# Patient Record
Sex: Male | Born: 1975 | Race: White | Hispanic: No | Marital: Married | Smoking: Former smoker
Health system: Southern US, Community
[De-identification: ages and names within clinical notes are randomized; demographics above are authoritative.]

## PROBLEM LIST (undated history)

## (undated) DIAGNOSIS — G4730 Sleep apnea, unspecified: Secondary | ICD-10-CM

## (undated) DIAGNOSIS — E781 Pure hyperglyceridemia: Secondary | ICD-10-CM

## (undated) DIAGNOSIS — F32A Depression, unspecified: Secondary | ICD-10-CM

## (undated) DIAGNOSIS — F329 Major depressive disorder, single episode, unspecified: Secondary | ICD-10-CM

## (undated) DIAGNOSIS — Z9289 Personal history of other medical treatment: Secondary | ICD-10-CM

## (undated) HISTORY — PX: OTHER SURGICAL HISTORY: SHX169

## (undated) HISTORY — DX: Personal history of other medical treatment: Z92.89

## (undated) HISTORY — DX: Major depressive disorder, single episode, unspecified: F32.9

## (undated) HISTORY — DX: Pure hyperglyceridemia: E78.1

## (undated) HISTORY — DX: Depression, unspecified: F32.A

## (undated) HISTORY — DX: Sleep apnea, unspecified: G47.30

---

## 1998-08-08 ENCOUNTER — Encounter: Payer: Self-pay | Admitting: Emergency Medicine

## 1998-08-08 ENCOUNTER — Emergency Department (HOSPITAL_COMMUNITY): Admission: EM | Admit: 1998-08-08 | Discharge: 1998-08-08 | Payer: Self-pay | Admitting: Emergency Medicine

## 1998-08-11 ENCOUNTER — Emergency Department (HOSPITAL_COMMUNITY): Admission: EM | Admit: 1998-08-11 | Discharge: 1998-08-11 | Payer: Self-pay | Admitting: Emergency Medicine

## 2001-01-20 ENCOUNTER — Encounter: Payer: Self-pay | Admitting: Emergency Medicine

## 2001-01-20 ENCOUNTER — Emergency Department (HOSPITAL_COMMUNITY): Admission: EM | Admit: 2001-01-20 | Discharge: 2001-01-20 | Payer: Self-pay | Admitting: Emergency Medicine

## 2006-07-18 ENCOUNTER — Ambulatory Visit (HOSPITAL_COMMUNITY): Admission: RE | Admit: 2006-07-18 | Discharge: 2006-07-18 | Payer: Self-pay | Admitting: Gynecology

## 2010-11-14 LAB — HEMOGLOBINOPATHY EVALUATION
Hemoglobin Other: 0 (ref 0.0–0.0)
Hgb A2 Quant: 2 — ABNORMAL LOW
Hgb A: 98 % — ABNORMAL HIGH
Hgb F Quant: 0 (ref 0.0–2.0)
Hgb S Quant: 0 % (ref 0.0–0.0)

## 2010-11-14 LAB — DIFFERENTIAL
Basophils Absolute: 0
Basophils Relative: 1
Eosinophils Absolute: 0.1
Eosinophils Relative: 1
Lymphocytes Relative: 48 — ABNORMAL HIGH
Lymphs Abs: 3.1
Monocytes Absolute: 0.5
Monocytes Relative: 7
Neutro Abs: 2.8
Neutrophils Relative %: 43

## 2010-11-14 LAB — CBC
HCT: 45.1
Hemoglobin: 15.2
MCHC: 33.7
MCV: 89.6
Platelets: 228
RBC: 5.03
RDW: 13
WBC: 6.5

## 2013-12-30 ENCOUNTER — Ambulatory Visit (INDEPENDENT_AMBULATORY_CARE_PROVIDER_SITE_OTHER): Payer: BC Managed Care – PPO | Admitting: Family Medicine

## 2013-12-30 ENCOUNTER — Ambulatory Visit (INDEPENDENT_AMBULATORY_CARE_PROVIDER_SITE_OTHER): Payer: BC Managed Care – PPO

## 2013-12-30 VITALS — BP 132/84 | HR 98 | Temp 98.3°F | Resp 16 | Ht 72.0 in | Wt 230.2 lb

## 2013-12-30 DIAGNOSIS — R05 Cough: Secondary | ICD-10-CM

## 2013-12-30 DIAGNOSIS — R059 Cough, unspecified: Secondary | ICD-10-CM

## 2013-12-30 DIAGNOSIS — J209 Acute bronchitis, unspecified: Secondary | ICD-10-CM

## 2013-12-30 LAB — POCT CBC
Granulocyte percent: 65.1 %G (ref 37–80)
HCT, POC: 45.5 % (ref 43.5–53.7)
Hemoglobin: 14.9 g/dL (ref 14.1–18.1)
LYMPH, POC: 1.8 (ref 0.6–3.4)
MCH: 30.7 pg (ref 27–31.2)
MCHC: 32.8 g/dL (ref 31.8–35.4)
MCV: 93.5 fL (ref 80–97)
MID (CBC): 0.5 (ref 0–0.9)
MPV: 7.5 fL (ref 0–99.8)
PLATELET COUNT, POC: 196 10*3/uL (ref 142–424)
POC Granulocyte: 4.3 (ref 2–6.9)
POC LYMPH PERCENT: 27.5 %L (ref 10–50)
POC MID %: 7.4 % (ref 0–12)
RBC: 4.86 M/uL (ref 4.69–6.13)
RDW, POC: 14.1 %
WBC: 6.6 10*3/uL (ref 4.6–10.2)

## 2013-12-30 MED ORDER — BENZONATATE 100 MG PO CAPS: 100.0000 mg | ORAL_CAPSULE | Freq: Three times a day (TID) | ORAL | Status: DC | PRN

## 2013-12-30 MED ORDER — PREDNISONE 20 MG PO TABS: ORAL_TABLET | ORAL | Status: DC

## 2013-12-30 MED ORDER — AZITHROMYCIN 250 MG PO TABS: ORAL_TABLET | ORAL | Status: DC

## 2013-12-30 NOTE — Patient Instructions (Signed)
Drink plenty of fluids and get enough rest  Take the Tessalon (benzonatate) pills one or 2 pills 3 times daily as needed for cough.  Take the prednisone 3 pills daily simultaneously after breakfast for 3 days  Take the azithromycin (antibiotic) to initially today, then 1 daily for 4 days  Continue to stay away from the cigarettes  If the cough continues to persist please return

## 2013-12-30 NOTE — Progress Notes (Signed)
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 He has been sick over the past month with a cough and some wheezing. He is felt like the probably pass on back, so he tried to titrate it through. He quit smoking about 3 weeks ago. Has not been running any fever. His wife had a similar problem, and it up with pneumonia, and is doing better. However her coughing does wake him up some. He seems to sleep okay most of the time. He does wear CPAP. He has not been documented as being febrile. He feels some rattling in the right lower lung anteriorly at times.  Objective: His TMs are normal. Throat clear. Neck supple without nodes. Chest is clear with exception of the right lower lobe anteriorly which at a scattered soft wheezes. Heart regular without murmurs.  Assessment: Cough Right lower lobe congestion and wheezing Recently quit cigarette smoking  Plan: Chest x-ray and CBC  Results for orders placed or performed in visit on 12/30/13  POCT CBC  Result Value Ref Range   WBC 6.6 4.6 - 10.2 K/uL   Lymph, poc 1.8 0.6 - 3.4   POC LYMPH PERCENT 27.5 10 - 50 %L   MID (cbc) 0.5 0 - 0.9   POC MID % 7.4 0 - 12 %M   POC Granulocyte 4.3 2 - 6.9   Granulocyte percent 65.1 37 - 80 %G   RBC 4.86 4.69 - 6.13 M/uL   Hemoglobin 14.9 14.1 - 18.1 g/dL   HCT, POC 16.145.5 09.643.5 - 53.7 %   MCV 93.5 80 - 97 fL   MCH, POC 30.7 27 - 31.2 pg   MCHC 32.8 31.8 - 35.4 g/dL   RDW, POC 04.514.1 %   Platelet Count, POC 196 142 - 424 K/uL   MPV 7.5 0 - 99.8 fL   [facility] reading (PRIMARY) by  Dr. Alwyn RenHopper Normal chest x-ray  Assessment: Bronchitis, etiology undetermined  Plan: Azithromycin which would cover also for atypicals Benzonatate 3 days of prednisone  Return if worse or not improving

## 2014-02-06 ENCOUNTER — Ambulatory Visit (INDEPENDENT_AMBULATORY_CARE_PROVIDER_SITE_OTHER): Payer: BC Managed Care – PPO | Admitting: Emergency Medicine

## 2014-02-06 VITALS — BP 130/84 | HR 83 | Temp 99.0°F | Resp 18 | Ht 72.0 in | Wt 235.8 lb

## 2014-02-06 DIAGNOSIS — Z23 Encounter for immunization: Secondary | ICD-10-CM

## 2014-02-06 DIAGNOSIS — S6991XA Unspecified injury of right wrist, hand and finger(s), initial encounter: Secondary | ICD-10-CM

## 2014-02-06 DIAGNOSIS — M6588 Other synovitis and tenosynovitis, other site: Secondary | ICD-10-CM

## 2014-02-06 DIAGNOSIS — W5501XA Bitten by cat, initial encounter: Secondary | ICD-10-CM

## 2014-02-06 DIAGNOSIS — M659 Synovitis and tenosynovitis, unspecified: Secondary | ICD-10-CM

## 2014-02-06 MED ORDER — MOXIFLOXACIN HCL 400 MG PO TABS: 400.0000 mg | ORAL_TABLET | Freq: Every day | ORAL | Status: DC

## 2014-02-06 MED ORDER — HYDROCODONE-ACETAMINOPHEN 5-325 MG PO TABS: 1.0000 | ORAL_TABLET | ORAL | Status: DC | PRN

## 2014-02-06 MED ORDER — CLINDAMYCIN HCL 300 MG PO CAPS: 300.0000 mg | ORAL_CAPSULE | Freq: Three times a day (TID) | ORAL | Status: DC

## 2014-02-06 MED ORDER — CEFTRIAXONE SODIUM 1 G IJ SOLR
1.0000 g | Freq: Once | INTRAMUSCULAR | Status: AC
  Administered 2014-02-06: 1 g via INTRAMUSCULAR

## 2014-02-06 NOTE — Patient Instructions (Signed)
Infectious Finger Tenosynovitis °Infection of the finger tendon sheath generally occurs because of a puncture wound which introduces bacteria into the tendon sheath. The tendons bend the fingers. The tendons are housed in tunnels called sheaths. These sheaths keep the tendons aligned with the finger joints.  °CAUSES  °When a puncture wound introduces bacteria, the closed space around the tendons can become the site of infection. Wounds are often the result of: °· Sharp objects such as a nail. °· An animal bite. °· A human bite. °SYMPTOMS  °The body sends white blood cells into the area to kill the bacteria. This process will produce inflammatory chemicals which cause: °· Swelling. °· Decreased motion. °· Severe pain with any motion of the finger. °Following the puncture wound the finger becomes increasingly swollen and warm. Pain with any motion soon follows. Red streaks may form on the skin which can move up into the forearm. Fever and chills usually result. This is an emergency and medical attention should be sought urgently. °DIAGNOSIS  °Your caregiver will easily make this diagnosis on examination. °TREATMENT  °· A splint may be used on a short term basis. Follow your caregiver's instructions on when and where to remove the splint for exercising the finger. °· The finger is usually splinted for a brief period of a few days while antibiotic therapy is initiated. Once the swelling, redness and warmth is improved, motion of the finger is begun. °· If the diagnosis is not clear your caregiver may prescribe antibiotics and ask you to follow-up within 24 to 48 hours for a repeat examination. °· Your caregiver may insert a needle in the tendon sheath (aspirate) to get a culture for the purpose of directing antibiotic therapy. °· Only take over-the-counter or prescription medicines for pain, discomfort, or fever as directed by your caregiver. °· Surgery is another treatment that is frequently used if the diagnosis is  clear. Small incisions are made along the palm and in the finger. A catheter is inserted to irrigate the tendon sheath after a culture is obtained. °· Occupational or hand therapy is often required to eliminate stiffness remaining in the finger. °COMPLICATIONS °Complications may occur. They include: °· Recurrence of the infection. This does not mean that the surgery was not well done. It means that the bacteria were not completely eliminated by the surgery and antibiotics. Repeat irrigation in the operating room may be required. °· Permanent stiffness of the finger can result if you do not follow through with instructions on exercises or fail to report for your occupational or hand therapy appointments. °SEEK IMMEDIATE MEDICAL CARE IF:  °If the swelling, redness, warmth and pain with motion recur, especially if accompanied by a fever of greater than 101° F (38.3° C). °Document Released: 04/12/2008 Document Revised: 01/19/2013 Document Reviewed: 04/12/2008 °ExitCare® Patient Information ©2015 [employer]. This information is not intended to replace advice given to you by your health care provider. Make sure you discuss any questions you have with your health care provider. ° °

## 2014-02-06 NOTE — Progress Notes (Signed)
[facility] [address], [place] [id number] [phone]  Date:  02/06/2014   Name:  Trevor SpragueDavid E Moquin   DOB:  [birth year]   MRN:  [id number]  PCP:  No PCP Per Patient    Chief Complaint: animal bite   History of Present Illness:  Trevor SpragueDavid E Trevor Leblanc is a 39 y.o. very pleasant male patient who presents with the following:  Bitten by housebound cat Friday in the left hand  Subsequently punched a wall and went for a walk Now has swollen erythematous painful left hand No fever or chills He is not current on tetanus Cat is current on rabies No improvement with over the counter medications or other home remedies.  Denies other complaint or health concern today.   There are no active problems to display for this patient.   Past Medical History  Diagnosis Date  . Depression   . Sleep apnea     History reviewed. No pertinent past surgical history.  History  Substance Use Topics  . Smoking status: Former Smoker    Quit date: 11/30/2013  . Smokeless tobacco: Not on file  . Alcohol Use: 6.0 oz/week    10 Not specified per week    Family History  Problem Relation Age of Onset  . Hypertension Mother   . Stroke Mother   . Hyperlipidemia Father   . Diabetes Maternal Grandmother   . Diabetes Paternal Grandmother   . Cancer Paternal Grandfather     Allergies  Allergen Reactions  . Penicillins Anaphylaxis    Medication list has been reviewed and updated.  Current Outpatient Prescriptions on File Prior to Visit  Medication Sig Dispense Refill  . azithromycin (ZITHROMAX) 250 MG tablet Take 2 tabs PO x 1 dose, then 1 tab PO QD x 4 days (Patient not taking: Reported on 02/06/2014) 6 tablet 0  . benzonatate (TESSALON) 100 MG capsule Take 1-2 capsules (100-200 mg total) by mouth 3 (three) times daily as needed. (Patient not taking: Reported on 02/06/2014) 30 capsule 0  . nicotine (NICODERM CQ - DOSED IN MG/24 HR) 7 mg/24hr patch Place 7 mg onto the skin daily.     . predniSONE (DELTASONE) 20 MG tablet Take 3 pills daily for 3 days for inflammation and lungs. Best taken after breakfast (Patient not taking: Reported on 02/06/2014) 9 tablet 0   No current facility-administered medications on file prior to visit.    Review of Systems:  As per HPI, otherwise negative.    Physical Examination: Filed Vitals:   02/06/14 1024  BP: 130/84  Pulse: 83  Temp: 99 F (37.2 C)  Resp: 18   Filed Vitals:   02/06/14 1024  Height: 6' (1.829 m)  Weight: 235 lb 12.8 oz (106.958 kg)   Body mass index is 31.97 kg/(m^2). Ideal Body Weight: Weight in (lb) to have BMI = 25: 183.9   GEN: WDWN, NAD, Non-toxic, Alert & Oriented x 3 HEENT: Atraumatic, Normocephalic.  Ears and Nose: No external deformity. EXTR: No clubbing/cyanosis/edema NEURO: Normal gait.  PSYCH: Normally interactive. Conversant. Not depressed or anxious appearing.  Calm demeanor.  Left hand:  Numerous scratches on forearm dorsally.  Bites at 3rd MCP joint. Marked erythema and swelling with guarding of 3rd finger passive extension   Assessment and Plan: Tenosynovitis vs joint infection  Cat bite Rocephin 1.5 gm moxifloxacin clinda (penicillin allergy_can take amox) Splint Hand Monday  Signed,  Phillips OdorJeffery Doria Fern, MD

## 2017-06-12 ENCOUNTER — Other Ambulatory Visit: Payer: Self-pay

## 2017-06-12 ENCOUNTER — Emergency Department (HOSPITAL_COMMUNITY)
Admission: EM | Admit: 2017-06-12 | Discharge: 2017-06-12 | Disposition: A | Discharge status: Home / Self Care | Payer: BC Managed Care – PPO | Attending: Emergency Medicine | Admitting: Emergency Medicine

## 2017-06-12 ENCOUNTER — Encounter (HOSPITAL_COMMUNITY): Payer: Self-pay | Admitting: Emergency Medicine

## 2017-06-12 ENCOUNTER — Emergency Department (HOSPITAL_COMMUNITY): Payer: BC Managed Care – PPO

## 2017-06-12 DIAGNOSIS — R079 Chest pain, unspecified: Secondary | ICD-10-CM | POA: Diagnosis present

## 2017-06-12 DIAGNOSIS — R072 Precordial pain: Secondary | ICD-10-CM | POA: Insufficient documentation

## 2017-06-12 DIAGNOSIS — Z79899 Other long term (current) drug therapy: Secondary | ICD-10-CM | POA: Insufficient documentation

## 2017-06-12 LAB — CBC
HEMATOCRIT: 44.9 % (ref 39.0–52.0)
HEMOGLOBIN: 14.9 g/dL (ref 13.0–17.0)
MCH: 30.5 pg (ref 26.0–34.0)
MCHC: 33.2 g/dL (ref 30.0–36.0)
MCV: 92 fL (ref 78.0–100.0)
Platelets: 234 10*3/uL (ref 150–400)
RBC: 4.88 MIL/uL (ref 4.22–5.81)
RDW: 12.4 % (ref 11.5–15.5)
WBC: 6.3 10*3/uL (ref 4.0–10.5)

## 2017-06-12 LAB — BASIC METABOLIC PANEL
ANION GAP: 11 (ref 5–15)
BUN: 14 mg/dL (ref 6–20)
CHLORIDE: 106 mmol/L (ref 101–111)
CO2: 21 mmol/L — AB (ref 22–32)
Calcium: 9.1 mg/dL (ref 8.9–10.3)
Creatinine, Ser: 0.85 mg/dL (ref 0.61–1.24)
GFR calc Af Amer: >60 mL/min (ref >60)
GFR calc non Af Amer: >60 mL/min (ref >60)
GLUCOSE: 110 mg/dL — AB (ref 65–99)
POTASSIUM: 4.5 mmol/L (ref 3.5–5.1)
Sodium: 138 mmol/L (ref 135–145)

## 2017-06-12 LAB — I-STAT TROPONIN, ED
Troponin i, poc: 0 ng/mL (ref 0.00–0.08)
Troponin i, poc: 0.01 ng/mL (ref 0.00–0.08)

## 2017-06-12 NOTE — ED Notes (Signed)
Pt verbalized understanding discharge instructions and denies any further needs or questions at this time. VS stable, ambulatory and steady gait.   

## 2017-06-12 NOTE — ED Provider Notes (Signed)
[facility] EMERGENCY DEPARTMENT Provider Note   CSN: [id number] Arrival date & time: 06/12/17  1033     History   Chief Complaint Chief Complaint  Patient presents with  . Chest Pain    HPI Trevor Leblanc is a 42 y.o. male.  Patient with possible history of hypertension, prediabetes, has not seen a doctor in about 5 years and has not been treated for these other than by diet, remote smoker --presents the emergency department today with complaint of chest pain described as a tightness.  Patient developed symptoms about 2 PM yesterday.  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  Symptoms did not really go away and he had difficulty sleeping last night.  He went to an urgent care today due to persistent symptoms and elevated BPs at home and was referred to the emergency department.  Patient denies any associated diaphoresis.  Patient does some light exercise (fishing mostly) and has not experienced any symptoms with exertion.  Tightness does not radiate.  While at the urgent care patient received aspirin and nitroglycerin which was relieved his symptoms.  No associated fever or cough.  No nausea, vomiting, or diarrhea. Family history of heart disease on fathers side but not at young ages. Patient denies risk factors for pulmonary embolism including: unilateral leg swelling, history of DVT/PE/other blood clots, use of exogenous hormones, recent immobilizations, recent surgery, recent travel (>4hr segment), malignancy, hemoptysis.       Past Medical History:  Diagnosis Date  . Depression   . Sleep apnea     There are no active problems to display for this patient.   History reviewed. No pertinent surgical history.      Home Medications    Prior to Admission medications   Medication Sig Start Date End Date Taking? Authorizing Provider  Multiple Vitamins-Minerals (MULTIVITAMIN WITH MINERALS) tablet Take 1 tablet by mouth daily.   Yes  [provider]  Omega-3 Fatty Acids (FISH OIL PO) Take 1 capsule by mouth daily.   Yes [provider]  benzonatate (TESSALON) 100 MG capsule Take 1-2 capsules (100-200 mg total) by mouth 3 (three) times daily as needed. Patient not taking: Reported on 02/06/2014 12/30/13   Peyton Najjar, MD  HYDROcodone-acetaminophen [facility]) 5-325 MG per tablet Take 1-2 tablets by mouth every 4 (four) hours as needed. Patient not taking: Reported on 06/12/2017 02/06/14   Carmelina Dane, MD    Family History Family History  Problem Relation Age of Onset  . Hypertension Mother   . Stroke Mother   . Hyperlipidemia Father   . Diabetes Maternal Grandmother   . Diabetes Paternal Grandmother   . Cancer Paternal Grandfather     Social History Social History   Tobacco Use  . Smoking status: Former Smoker    Last attempt to quit: 11/30/2013    Years since quitting: 3.5  . Smokeless tobacco: Never Used  Substance Use Topics  . Alcohol use: Yes    Alcohol/week: 6.0 oz    Types: 10 Standard drinks or equivalent per week  . Drug use: No     Allergies   Penicillins   Review of Systems Review of Systems  Constitutional: Negative for diaphoresis and fever.  Eyes: Negative for redness.  Respiratory: Negative for cough and shortness of breath.   Cardiovascular: Positive for chest pain. Negative for palpitations and leg swelling.  Gastrointestinal: Negative for abdominal pain, nausea and vomiting.  Genitourinary: Negative  for dysuria.  Musculoskeletal: Negative for back pain and neck pain.  Skin: Negative for rash.  Neurological: Negative for syncope and light-headedness.  Psychiatric/Behavioral: The patient is not nervous/anxious.      Physical Exam Updated Vital Signs BP (!) 161/113 (BP Location: Right Arm)   Pulse 84   Temp 98.2 F (36.8 C) (Oral)   Resp 18   SpO2 100%   Physical Exam  Constitutional: He appears well-developed and well-nourished.  HENT:  Head:  Normocephalic and atraumatic.  Mouth/Throat: Mucous membranes are normal. Mucous membranes are not dry.  Eyes: Conjunctivae are normal.  Neck: Trachea normal and normal range of motion. Neck supple. Normal carotid pulses and no JVD present. No muscular tenderness present. Carotid bruit is not present. No tracheal deviation present.  Cardiovascular: Normal rate, regular rhythm, S1 normal, S2 normal, normal heart sounds and intact distal pulses. Exam reveals no distant heart sounds and no decreased pulses.  No murmur heard. Pulmonary/Chest: Effort normal and breath sounds normal. No respiratory distress. He has no wheezes. He exhibits no tenderness.  Abdominal: Soft. Normal aorta and bowel sounds are normal. There is no tenderness. There is no rebound and no guarding.  Musculoskeletal: He exhibits no edema.  Neurological: He is alert.  Skin: Skin is warm and dry. He is not diaphoretic. No cyanosis. No pallor.  Psychiatric: He has a normal mood and affect.  Nursing note and vitals reviewed.    ED Treatments / Results  Labs (all labs ordered are listed, but only abnormal results are displayed) Labs Reviewed  BASIC METABOLIC PANEL - Abnormal; Notable for the following components:      Result Value   CO2 21 (*)    Glucose, Bld 110 (*)    All other components within normal limits  CBC  I-STAT TROPONIN, ED  I-STAT TROPONIN, ED    EKG None   ED ECG REPORT   Date: 06/12/2017  Rate: 81  Rhythm: normal sinus rhythm  QRS Axis: normal  Intervals: normal  ST/T Wave abnormalities: normal  Conduction Disutrbances:none  Narrative Interpretation:   Old EKG Reviewed: none available  I have personally reviewed the EKG tracing and agree with the computerized printout as noted.   Radiology Dg Chest 2 View  Result Date: 06/12/2017 CLINICAL DATA:  Chest pain, hypertension EXAM: CHEST - 2 VIEW COMPARISON:  Chest x-ray of 12/30/2013 FINDINGS: No active infiltrate or effusion is seen.  Mediastinal and hilar contours are unremarkable. The heart is borderline enlarged. No bony abnormality is seen. IMPRESSION: Borderline cardiomegaly.  No active lung disease. Electronically Signed   By: Dwyane Dee M.D.   On: 06/12/2017 11:28    Procedures Procedures (including critical care time)  Medications Ordered in ED Medications - No data to display   Initial Impression / Assessment and Plan / ED Course  I have reviewed the triage vital signs and the nursing notes.  Pertinent labs & imaging results that were available during my care of the patient were reviewed by me and considered in my medical decision making (see chart for details).     Patient seen and examined. EKG reviewed. Work-up initiated.  Pending second troponin.  If negative, anticipate discharged home with cardiology/PCP follow-up.  Vital signs reviewed and are as follows: BP (!) 161/113 (BP Location: Right Arm)   Pulse 84   Temp 98.2 F (36.8 C) (Oral)   Resp 18   Ht  (1.778 m)   Wt 106.6 kg (235 lb)   SpO2 100%  BMI 33.72 kg/m   3:11 PM second troponin is negative.  Patient's pain remains controlled.  We will discharge to home.  Patient was counseled to return with severe chest pain, especially if the pain is crushing or pressure-like and spreads to the arms, back, neck, or jaw, or if they have sweating, nausea, or shortness of breath with the pain. They were encouraged to call 911 with these symptoms.   They were also told to return if their chest pain gets worse and does not go away with rest, they have an attack of chest pain lasting longer than usual despite rest and treatment with the medications their caregiver has prescribed, if they wake from sleep with chest pain or shortness of breath, if they feel dizzy or faint, if they have chest pain not typical of their usual pain, or if they have any other emergent concerns regarding their health.  The patient verbalized understanding and agreed.     Final Clinical Impressions(s) / ED Diagnoses   Final diagnoses:  Precordial pain   Patient with CP since yesterday. EKG without acute ischemia. Trop neg x 2.  HEART = 1-2 based on questionable number of risk factors. ? Cardiomegaly based on EKG and CXR.  No significant risk factors for PE and symptoms are not suggestive of such.  No tachycardia or hypoxia.  No clinical signs of DVT.  Doubt dissection given presentation. Will have patient follow-up with PCP and cardiology for further evaluation.   No dangerous or life-threatening conditions suspected or identified by history, physical exam, and by work-up. No indications for hospitalization identified.    ED Discharge Orders    None       Renne Crigler, Cordelia Poche 06/12/17 1529    Margarita Grizzle, MD 06/12/17 2231

## 2017-06-12 NOTE — ED Triage Notes (Signed)
Pt to ER for evaluation of 2 days non-radiating CP. Was sent from [facility] for further evaluation. Was given 324 mg aspirin and 0.4 nitro with complete relief. 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 Hx of HTN, high cholesterol, and has not seen a PCP in 5+ years. 18 g LH.

## 2017-06-12 NOTE — Discharge Instructions (Signed)
Please read and follow all provided instructions.  Your diagnoses today include:  1. Precordial pain     Tests performed today include:  An EKG of your heart  A chest x-ray - shows borderline large heart size  Cardiac enzymes - a blood test for heart muscle damage  Blood counts and electrolytes  Vital signs. See below for your results today.   Medications prescribed:   None  Take any prescribed medications only as directed.  Follow-up instructions: Please follow-up with your primary care provider and the cardiologist as soon as you can for further evaluation of your symptoms.   Return instructions:  SEEK IMMEDIATE MEDICAL ATTENTION IF:  You have severe chest pain, especially if the pain is crushing or pressure-like and spreads to the arms, back, neck, or jaw, or if you have sweating, nausea (feeling sick to your stomach), or shortness of breath. THIS IS AN EMERGENCY. Don't wait to see if the pain will go away. Get medical help at once. Call 911 or 0 (operator). DO NOT drive yourself to the hospital.   Your chest pain gets worse and does not go away with rest.   You have an attack of chest pain lasting longer than usual, despite rest and treatment with the medications your caregiver has prescribed.   You wake from sleep with chest pain or shortness of breath.  You feel dizzy or faint.  You have chest pain not typical of your usual pain for which you originally saw your caregiver.   You have any other emergent concerns regarding your health.  Additional Information: Chest pain comes from many different causes. Your caregiver has diagnosed you as having chest pain that is not specific for one problem, but does not require admission.  You are at low risk for an acute heart condition or other serious illness.   Your vital signs today were: BP (!) 161/113 (BP Location: Right Arm)    Pulse 84    Temp 98.2 F (36.8 C) (Oral)    Resp 18    Ht  (1.778 m)    Wt 106.6 kg  (235 lb)    SpO2 100%    BMI 33.72 kg/m  If your blood pressure (BP) was elevated above 135/85 this visit, please have this repeated by your doctor within one month. --------------

## 2017-07-23 ENCOUNTER — Encounter: Payer: Self-pay | Admitting: Physician Assistant

## 2017-07-23 ENCOUNTER — Ambulatory Visit: Payer: BC Managed Care – PPO | Admitting: Physician Assistant

## 2017-07-23 VITALS — BP 130/82 | HR 73 | Ht 70.0 in | Wt 240.0 lb

## 2017-07-23 DIAGNOSIS — I517 Cardiomegaly: Secondary | ICD-10-CM | POA: Diagnosis not present

## 2017-07-23 DIAGNOSIS — G4733 Obstructive sleep apnea (adult) (pediatric): Secondary | ICD-10-CM | POA: Diagnosis not present

## 2017-07-23 DIAGNOSIS — R0789 Other chest pain: Secondary | ICD-10-CM | POA: Diagnosis not present

## 2017-07-23 NOTE — Progress Notes (Signed)
Cardiology Office Note:    Date:  07/23/2017   ID:  Trevor Leblanc, DOB 04/11/1975, MRN [id number]  PCP:  Medicine, [facility]  Cardiologist:  New   Referring MD: Medicine, Trevor Leblanc Niece, [place])  Chief Complaint  Patient presents with  . Chest Pain    History of Present Illness:    Trevor Leblanc is a 42 y.o. male with depression who is being seen today for the evaluation of chest pain at the request of Medicine, [facility] ([address] [place], [place]).    Trevor Leblanc has no known history of coronary artery disease.  He was seen in the ED last month with chest pain and elevated blood pressure.  Troponin levels were negative. He had a chest X-ray with borderline cardiomegaly.  He notes that his pain had been going on for 2 days when he went to urgent care.  His BP was 180/100 and he was sent to the ED.  His pain was a left sided and substernal tightness.  He did have some associated shortness of breath, nausea.  There was no radiation.  It started during a time of emotional stress. He denies exertional chest pain or dyspnea on exertion.  Of note, his pain improved with NTG in the ED.  He has followed up with primary care since.  He was drinking several alcoholic drinks a day.  He reduced this and made some lifestyle changes.  He was also placed on Prozac for depression.  His blood pressure has been good since.  He has not had any further chest pain.    PAD Screen 07/23/2017  Previous PAD dx? No  Previous surgical procedure? No  Pain with walking? No  Feet/toe relief with dangling? No  Painful, non-healing ulcers? No  Extremities discolored? No    Prior CV studies:   The following studies were reviewed today:  None   Past Medical History:  Diagnosis Date  . Depression   . High triglycerides   . Sleep apnea    on CPAP (previously saw Dr. Brett Fairy)    Past Surgical History:  Procedure Laterality Date  . lesion excised from finger      Current  Medications: Current Meds  Medication Sig  . FLUoxetine (PROZAC) 10 MG capsule Take 1 capsule by mouth daily.  . Multiple Vitamins-Minerals (MULTIVITAMIN WITH MINERALS) tablet Take 1 tablet by mouth daily.  . Omega-3 Fatty Acids (FISH OIL PO) Take 1 capsule by mouth daily.     Allergies:   Penicillins   Social History   Socioeconomic History  . Marital status: Married    Spouse name: Not on file  . Number of children: 1  . Years of education: Not on file  . Highest education level: Not on file  Occupational History  . Occupation: Consulting civil engineer: [employer]    Comment: [employer]  Social Needs  . Financial resource strain: Not on file  . Food insecurity:    Worry: Not on file    Inability: Not on file  . Transportation needs:    Medical: Not on file    Non-medical: Not on file  Tobacco Use  . Smoking status: Former Smoker    Last attempt to quit: 11/30/2013    Years since quitting: 3.6  . Smokeless tobacco: Never Used  Substance and Sexual Activity  . Alcohol use: Yes    Alcohol/week: 6.0 oz    Types: 10 Standard drinks  or equivalent per week  . Drug use: No  . Sexual activity: Not on file  Lifestyle  . Physical activity:    Days per week: Not on file    Minutes per session: Not on file  . Stress: Not on file  Relationships  . Social connections:    Talks on phone: Not on file    Gets together: Not on file    Attends religious service: Not on file    Active member of club or organization: Not on file    Attends meetings of clubs or organizations: Not on file    Relationship status: Not on file  Other Topics Concern  . Not on file  Social History Narrative   Living in [place] area since late 1990s     Family Hx: The patient's family history includes CAD in his other; Cancer in his paternal grandfather; Depression in his mother; Diabetes in his maternal grandmother and paternal grandmother; Hyperlipidemia in his father;  Hypertension in his mother.  ROS:   Please see the history of present illness.    Review of Systems  Cardiovascular: Positive for chest pain.  Respiratory: Positive for snoring.   Psychiatric/Behavioral: Positive for depression. The patient is nervous/anxious.    All other systems reviewed and are negative.   EKGs/Labs/Other Test Reviewed:    EKG:  EKG is ordered today.  The ekg ordered today demonstrates normal sinus rhythm, HR 73, normal axis, QTc 423 ms  Recent Labs: 06/12/2017: BUN 14; Creatinine, Ser 0.85; Hemoglobin 14.9; Platelets 234; Potassium 4.5; Sodium 138   Labs from 06/17/17: Component Name Value Ref Range  WBC 5.5 3.4 - 10.8 x10E3/uL  RBC 4.69 4.14 - 5.8 x10E6/uL  Hemoglobin 14.8 13 - 17.7 g/dL  Hematocrit 42.1 37.5 - 51 %  MCV 90 79 - 97 fL  MCH 31.6 26.6 - 33 pg  MCHC 35.2 31.5 - 35.7 g/dL  RDW 14.2 12.3 - 15.4 %  Platelet Count 196  Comment:  **Please note reference interval change** 150 - 450 x10E3/uL   Component Name Value Ref Range  Glucose 101 (H) 65 - 99 mg/dL  BUN 15 6 - 24 mg/dL  Creatinine, Serum 0.88 0.76 - 1.27 mg/dL  eGFR If NonAfrican American 106 >59 mL/min/1.73  eGFR If African American 122 >59 mL/min/1.73  BUN/Creatinine Ratio 17 9 - 20   Sodium 139 134 - 144 mmol/L  Potassium 4.3 3.5 - 5.2 mmol/L  Chloride 103 96 - 106 mmol/L  CO2 21 20 - 29 mmol/L  CALCIUM 9.6 8.7 - 10.2 mg/dL  Total Protein 7.0 6 - 8.5 g/dL  Albumin, Serum 4.9 3.5 - 5.5 g/dL  Globulin, Total 2.1 1.5 - 4.5 g/dL  Albumin/Globulin Ratio 2.3 (H) 1.2 - 2.2   Total Bilirubin 0.4 0 - 1.2 mg/dL  Alkaline Phosphatase 39 39 - 117 IU/L  AST 26 0 - 40 IU/L  ALT (SGPT) 39 0 - 44 IU/L   Component Name Value Ref Range  Cholesterol, Total 212 (H) 100 - 199 mg/dL  Triglycerides 242 (H) 0 - 149 mg/dL  HDL 38 (L) >39 mg/dL  VLDL Cholesterol Cal 48 (H) 5 - 40 mg/dL  LDL 126 (H) 0 - 99 mg/dL     Recent Lipid Panel No results found for: CHOL, TRIG, HDL, CHOLHDL, LDLCALC,  LDLDIRECT  Physical Exam:    VS:  BP 130/82   Pulse 73   Ht '5\' 10"'  (1.778 m)   Wt 240 lb (108.9 kg)   SpO2  98%   BMI 34.44 kg/m     Wt Readings from Last 3 Encounters:  07/23/17 240 lb (108.9 kg)  06/12/17 235 lb (106.6 kg)  02/06/14 235 lb 12.8 oz (107 kg)     Physical Exam  Constitutional: He is oriented to person, place, and time. He appears well-developed and well-nourished. No distress.  HENT:  Head: Normocephalic and atraumatic.  Neck: No JVD present. Carotid bruit is not present.  Cardiovascular: Normal rate, regular rhythm and normal heart sounds.  No murmur heard. Pulmonary/Chest: Effort normal and breath sounds normal. He has no rales.  Abdominal: Soft. There is no hepatomegaly.  Musculoskeletal: He exhibits no edema.  Neurological: He is alert and oriented to person, place, and time.  Skin: Skin is warm and dry.    ASSESSMENT & PLAN:    Other chest pain His chest pain is somewhat atypical for ischemia.  But, he did have relief with NTG.  His symptoms may have been related to a GI etiology or esophageal spasm.  He has not had any further symptoms.  His BP is better on antidepressant medication.  He has very few risk factors for CAD (very remote FHx, remote hx of smoking).  His ECG is without ischemic changes.  I have recommend proceeding with an exercise tolerance test to screen for ischemic heart disease.  I discussed this with Dr. Rayann Heman (attending MD) who agreed.  -Schedule GXT  Cardiomegaly  Borderline cardiomegaly noted on recent CXR.  This may be related to his hx of obstructive sleep apnea.  Will obtain an echocardiogram to assess LV and RV function and pulmonary pressures.   OSA (obstructive sleep apnea)  Continue follow up with neurology.   Dispo:  Return as needed with Richardson Dopp, PA-C depending on test results or if symptoms recur .   Medication Adjustments/Labs and Tests Ordered: Current medicines are reviewed at length with the patient today.   Concerns regarding medicines are outlined above.  Orders/Tests:  Orders Placed This Encounter  Procedures  . Exercise Tolerance Test  . EKG 12-Lead  . ECHOCARDIOGRAM COMPLETE   Medication changes: No orders of the defined types were placed in this encounter.  Signed, Richardson Dopp, PA-C  07/23/2017 10:31 AM    [facility] [address] [place], [place], [place]  [address] Phone: [phone]; Fax: [phone]

## 2017-07-23 NOTE — Patient Instructions (Signed)
Medication Instructions:  1. Your physician recommends that you continue on your current medications as directed. Please refer to the Current Medication list given to you today.   Labwork: NONE ORDERED TODAY  Testing/Procedures: 1. Your physician has requested that you have an echocardiogram. Echocardiography is a painless test that uses sound waves to create images of your heart. It provides your doctor with information about the size and shape of your heart and how well your heart's chambers and valves are working. This procedure takes approximately one hour. There are no restrictions for this procedure.  2. Your physician has requested that you have an exercise tolerance test. For further information please visit www.[email or web address]. Please also follow instruction sheet, as given.    Follow-Up: FOLLOW UP AS NEEDED PENDING TEST RESULTS  Any Other Special Instructions Will Be Listed Below (If Applicable).     If you need a refill on your cardiac medications before your next appointment, please call your pharmacy.   

## 2017-08-05 ENCOUNTER — Other Ambulatory Visit: Payer: Self-pay

## 2017-08-05 ENCOUNTER — Ambulatory Visit (HOSPITAL_COMMUNITY): Payer: BC Managed Care – PPO | Attending: Cardiovascular Disease

## 2017-08-05 ENCOUNTER — Ambulatory Visit (INDEPENDENT_AMBULATORY_CARE_PROVIDER_SITE_OTHER): Payer: BC Managed Care – PPO

## 2017-08-05 DIAGNOSIS — G4733 Obstructive sleep apnea (adult) (pediatric): Secondary | ICD-10-CM | POA: Insufficient documentation

## 2017-08-05 DIAGNOSIS — R0789 Other chest pain: Secondary | ICD-10-CM | POA: Insufficient documentation

## 2017-08-05 DIAGNOSIS — Z87891 Personal history of nicotine dependence: Secondary | ICD-10-CM | POA: Diagnosis not present

## 2017-08-05 DIAGNOSIS — I517 Cardiomegaly: Secondary | ICD-10-CM

## 2017-08-05 LAB — EXERCISE TOLERANCE TEST
CSEPED: 9 min
CSEPEDS: 41 s
CSEPHR: 99 %
Estimated workload: 11.2 METS
MPHR: 177 {beats}/min
Peak HR: 176 {beats}/min
RPE: 17
Rest HR: 75 {beats}/min

## 2017-08-06 ENCOUNTER — Encounter: Payer: Self-pay | Admitting: Physician Assistant

## 2017-08-07 ENCOUNTER — Encounter: Payer: Self-pay | Admitting: *Deleted

## 2017-08-07 ENCOUNTER — Telehealth: Payer: Self-pay | Admitting: *Deleted

## 2017-08-07 DIAGNOSIS — R0789 Other chest pain: Secondary | ICD-10-CM

## 2017-08-07 DIAGNOSIS — R931 Abnormal findings on diagnostic imaging of heart and coronary circulation: Secondary | ICD-10-CM

## 2017-08-07 MED ORDER — METOPROLOL TARTRATE 50 MG PO TABS: 50.0000 mg | ORAL_TABLET | Freq: Once | ORAL | 0 refills | Status: DC

## 2017-08-07 NOTE — Telephone Encounter (Signed)
Pt has been notified of test results and findings by phone with verbal understanding. Pt has been advised of recommendation for Coronary [facility]. Pt is agreeable to this plan of care. Pt aware he will need to come in the office 1 week prior to CT for lab work (BMET) to be done. Pt aware scheduler will call him with a date and time for the CT. Pt said he will be out of town 7/18-7/20/19. Pt thanked me for the call.

## 2017-08-07 NOTE — Telephone Encounter (Signed)
This encounter was created in error - please disregard.

## 2017-08-07 NOTE — Telephone Encounter (Signed)
-----   Message from Beatrice LecherScott T Weaver, [place] sent at 08/06/2017  5:28 PM EDT ----- Please call the patient. Stress test without EKG changes.  See echocardiogram results.  He needs a coronary CTA to rule out CAD. Please fax a copy to PCP:  [facility]  Scott Logans[place]Weaver, [place]    08/06/2017 5:27 PM

## 2017-08-07 NOTE — Telephone Encounter (Signed)
-----   Message from Beatrice LecherScott T Weaver, [place] sent at 08/06/2017  5:27 PM EDT ----- Please call the patient. Please see exercise tolerance test results as well. The echocardiogram does show some wall motion abnormalities on the bottom part of the heart.  This may be a sign of underlying blockages. Please arrange coronary CTA with [facility] (use diagnosis of chest pain, abnormal echocardiogram). Arrange follow-up 2 weeks after CT with me to review findings. Please fax a copy to PCP:  [facility], [facility]  Scott GarnerWeaver, [place]    08/06/2017 5:25 PM

## 2017-08-26 ENCOUNTER — Ambulatory Visit (HOSPITAL_COMMUNITY): Payer: BC Managed Care – PPO

## 2017-08-26 ENCOUNTER — Telehealth: Payer: Self-pay | Admitting: *Deleted

## 2017-08-26 ENCOUNTER — Ambulatory Visit (HOSPITAL_COMMUNITY)
Admission: RE | Admit: 2017-08-26 | Discharge: 2017-08-26 | Disposition: A | Discharge status: Home / Self Care | Payer: BC Managed Care – PPO | Source: Ambulatory Visit | Attending: Physician Assistant | Admitting: Physician Assistant

## 2017-08-26 ENCOUNTER — Encounter: Payer: Self-pay | Admitting: Physician Assistant

## 2017-08-26 DIAGNOSIS — R0789 Other chest pain: Secondary | ICD-10-CM

## 2017-08-26 DIAGNOSIS — R079 Chest pain, unspecified: Secondary | ICD-10-CM

## 2017-08-26 DIAGNOSIS — R931 Abnormal findings on diagnostic imaging of heart and coronary circulation: Secondary | ICD-10-CM | POA: Diagnosis present

## 2017-08-26 MED ORDER — IOPAMIDOL (ISOVUE-370) INJECTION 76%
100.0000 mL | Freq: Once | INTRAVENOUS | Status: AC | PRN
  Administered 2017-08-26: 100 mL via INTRAVENOUS

## 2017-08-26 MED ORDER — METOPROLOL TARTRATE 5 MG/5ML IV SOLN
INTRAVENOUS | Status: AC
  Filled 2017-08-26: qty 10

## 2017-08-26 MED ORDER — NITROGLYCERIN 0.4 MG SL SUBL
SUBLINGUAL_TABLET | SUBLINGUAL | Status: AC
  Filled 2017-08-26: qty 2

## 2017-08-26 MED ORDER — NITROGLYCERIN 0.4 MG SL SUBL
0.8000 mg | SUBLINGUAL_TABLET | Freq: Once | SUBLINGUAL | Status: AC
Start: 2017-08-26 — End: 2017-08-26
  Administered 2017-08-26: 0.8 mg via SUBLINGUAL
  Filled 2017-08-26: qty 25

## 2017-08-26 MED ORDER — IOPAMIDOL (ISOVUE-370) INJECTION 76%
INTRAVENOUS | Status: AC
  Filled 2017-08-26: qty 100

## 2017-08-26 MED ORDER — METOPROLOL TARTRATE 5 MG/5ML IV SOLN
5.0000 mg | INTRAVENOUS | Status: DC | PRN
  Administered 2017-08-26: 5 mg via INTRAVENOUS
  Filled 2017-08-26: qty 5

## 2017-08-26 NOTE — Telephone Encounter (Signed)
-----   Message from Beatrice LecherScott T Weaver, [place] sent at 08/26/2017  4:42 PM EDT ----- This study demonstrates: Normal coronary arteries and calcium score of 0. Medication changes / Follow up studies / Other recommendations:   Continue current medications and follow up as planned.  Please send results to the PCP:  [facility]  Scott [place]aver, [place] 08/26/2017 4:41 PM

## 2017-08-26 NOTE — Telephone Encounter (Signed)
DPR ok to leave message on phone. Left message calcium score 0. No changes to be made with medications at this time, continue with current Tx plan. I will fax a [facility] results [facility]. Left message if any questions please call [phone].

## 2017-08-28 ENCOUNTER — Telehealth: Payer: Self-pay | Admitting: *Deleted

## 2017-08-28 NOTE — Telephone Encounter (Signed)
Pt has been notified of Coronary CT results by phone with verbal understanding. Pt thanked me for the call.

## 2017-08-28 NOTE — Telephone Encounter (Signed)
-----   Message from Beatrice LecherScott T Weaver, [place] sent at 08/27/2017  5:36 PM EDT ----- The extra-cardiac findings on the CT are normal. Tereso NewcomerScott Weaver, PA-C    08/27/2017 5:36 PM

## 2019-10-09 IMAGING — DX DG CHEST 2V
2 series · 2 of 2 positions shown · non-contrast
Comparison: Chest x-ray of 12/30/2013

CLINICAL DATA: Chest pain, hypertension

EXAM:
CHEST - 2 VIEW

[w chest pa]
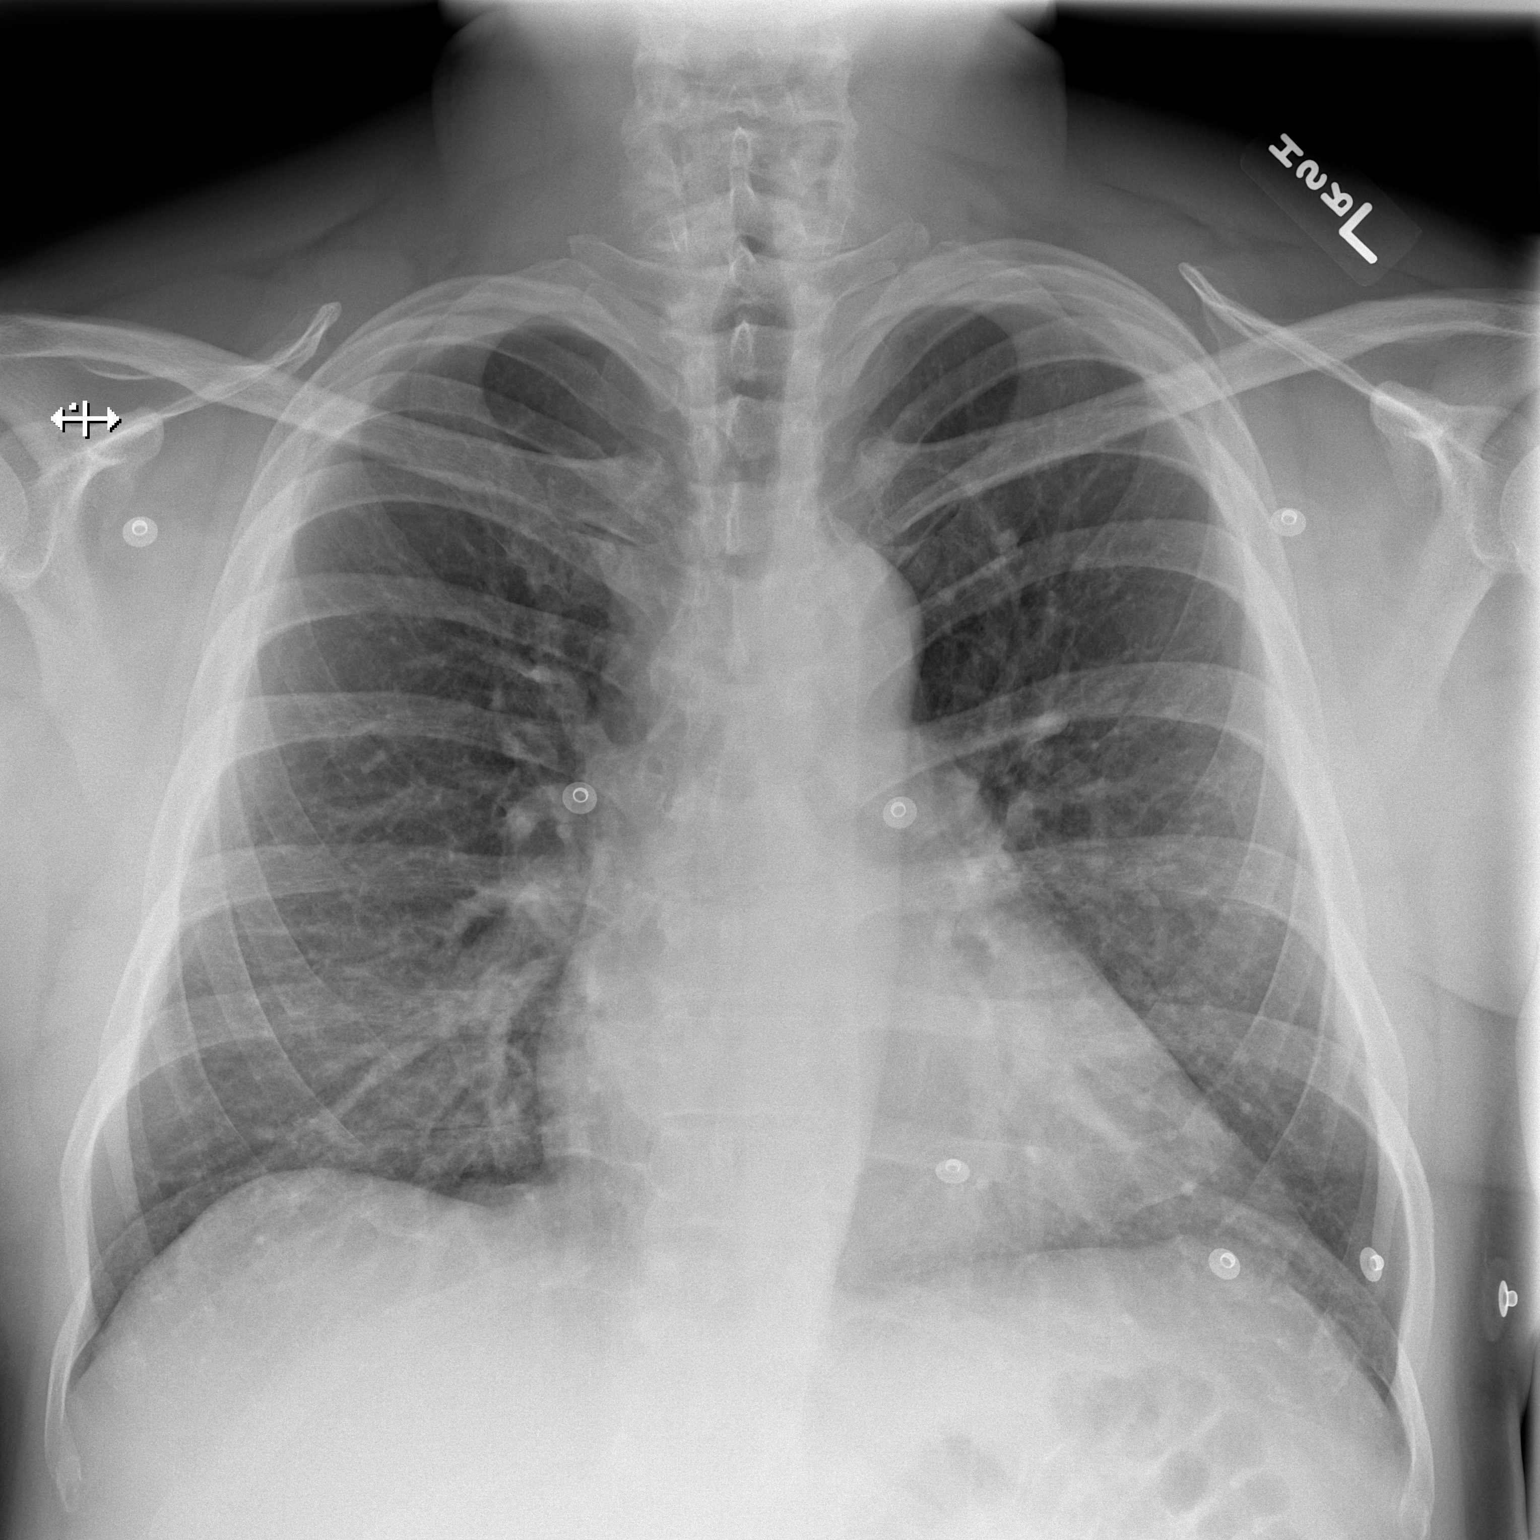

[w chest lat]
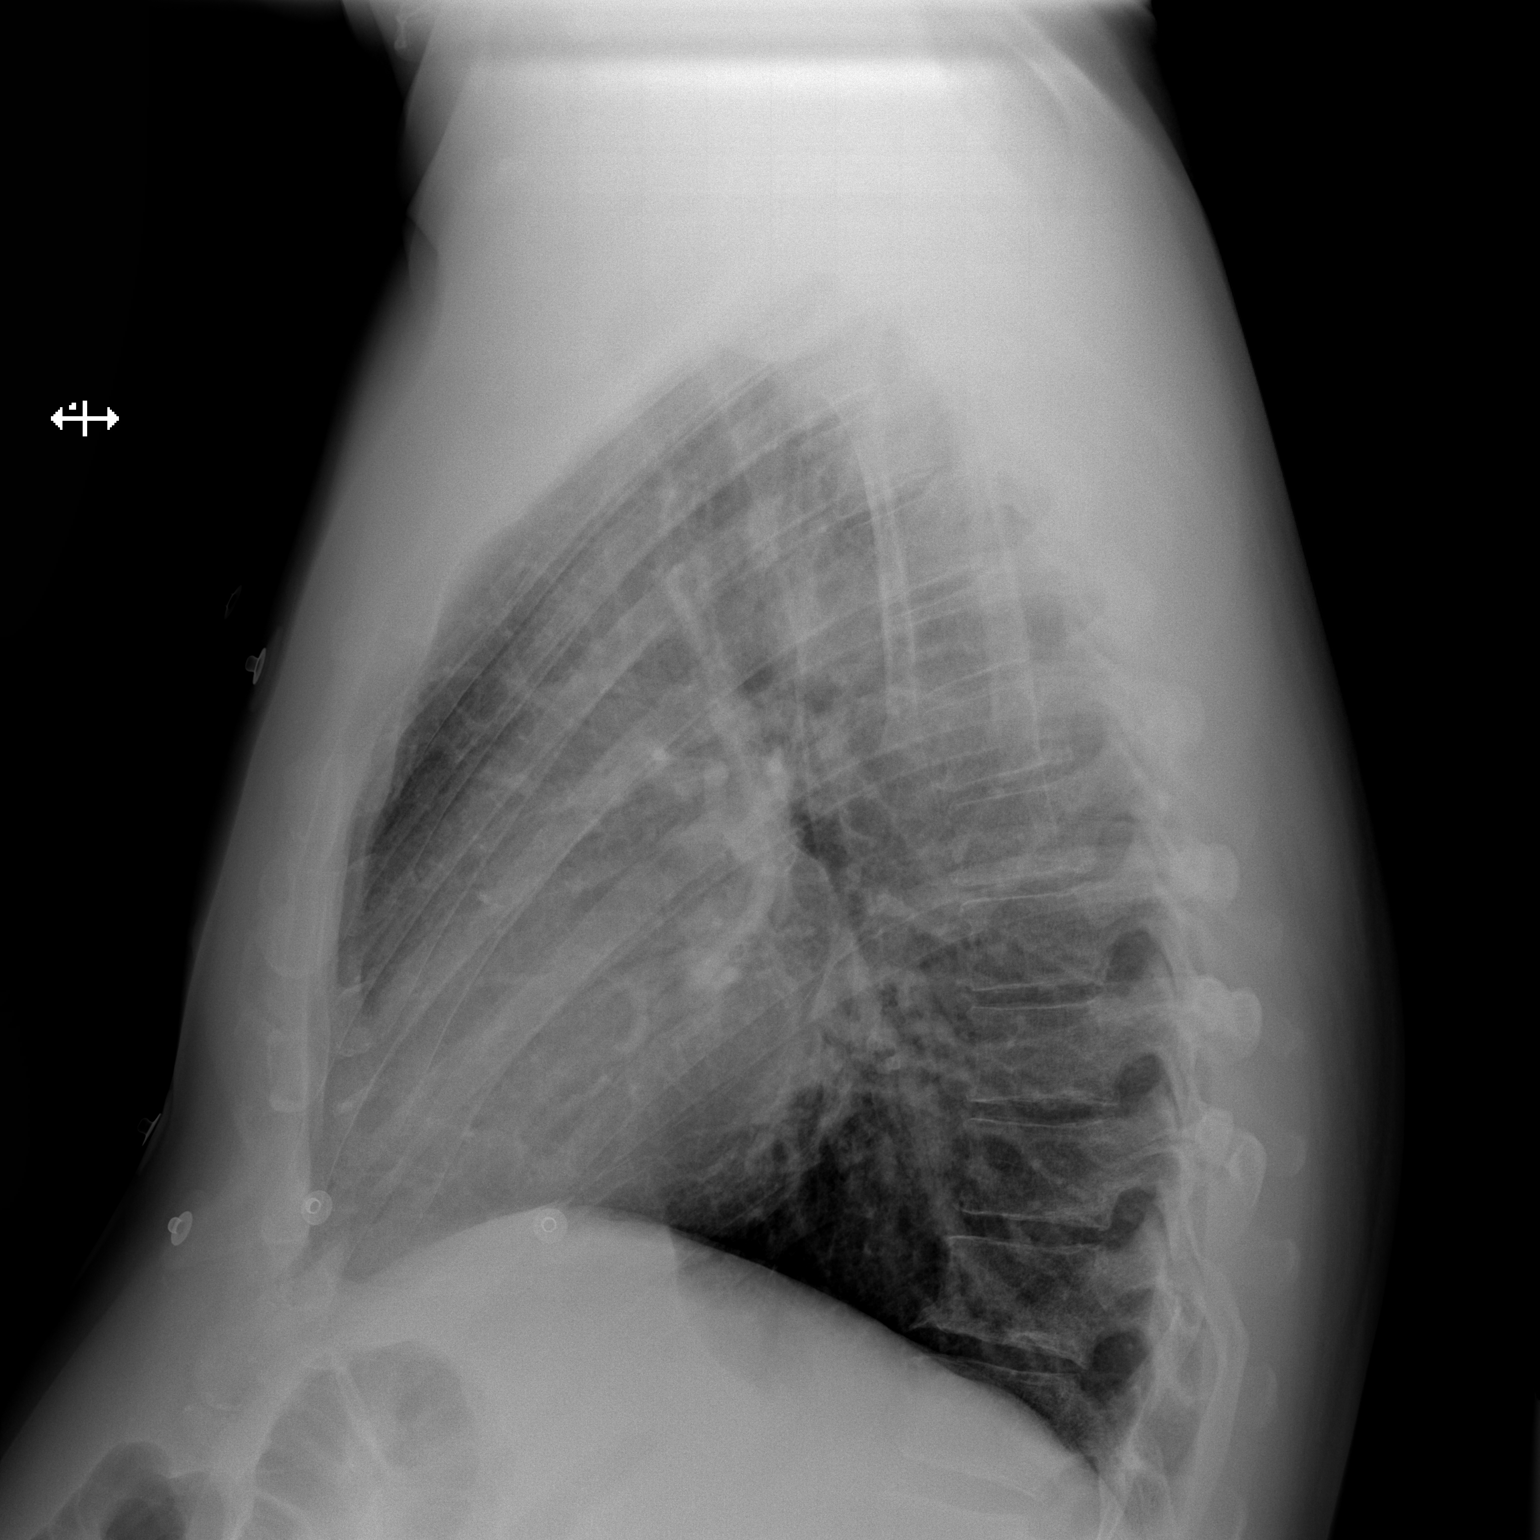

[2 of 2 positions shown; findings below may reference images not displayed]

FINDINGS: No active infiltrate or effusion is seen. Mediastinal and hilar
contours are unremarkable. The heart is borderline enlarged. No bony
abnormality is seen.
IMPRESSION: Borderline cardiomegaly.  No active lung disease.

## 2019-12-23 IMAGING — CT CT HEART MORP W/ CTA COR W/ SCORE W/ CA W/CM &/OR W/O CM
4 of 7 series · 8 of 20 positions shown, 9 images · IV contrast (APPLIED)
Comparison: None.

EXAM:
OVER-READ INTERPRETATION  CT CHEST

The following report is an over-read performed by radiologist Dr.
over-read does not include interpretation of cardiac or coronary
anatomy or pathology. The coronary CTA interpretation by the
cardiologist is attached.
CLINICAL DATA: Chest pain
Cardiac CTA
MEDICATIONS:
Sub lingual nitro. 4mg and lopressor 10mg
TECHNIQUE: The patient was scanned on a Siemens Force [REDACTED]ice scanner. Gantry
rotation speed was 250 msecs. Collimation was .6 mm. A 100 kV
prospective scan was triggered in the ascending thoracic aorta at
140 HU's Full mA was used between 35% and 75% of the R-R interval.
Average HR during the scan was 61 bpm. The 3D data set was
interpreted on a dedicated work station using MPR, MIP and VRT
modes. A total of 80 cc of contrast was used.

[Series 6: best diast 70 % · axial · 0.43mm/px · z∈[+864,+927]mm · 2 of 478 slices shown, 3 images]
[im 160/478  vessel]
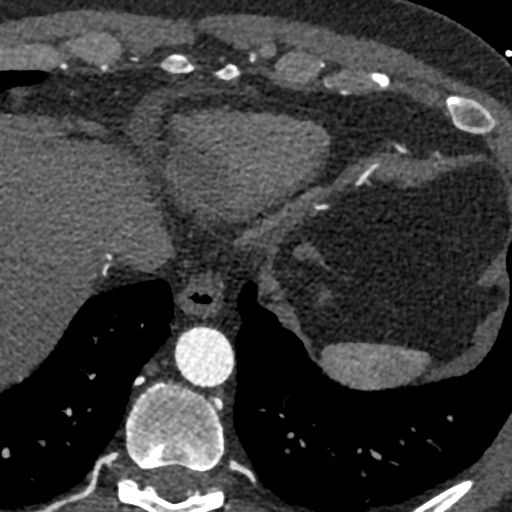
[im 160/478  lung]
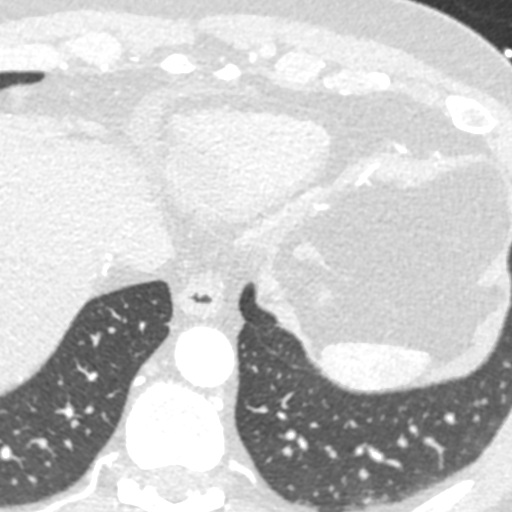
[im 319/478  vessel]
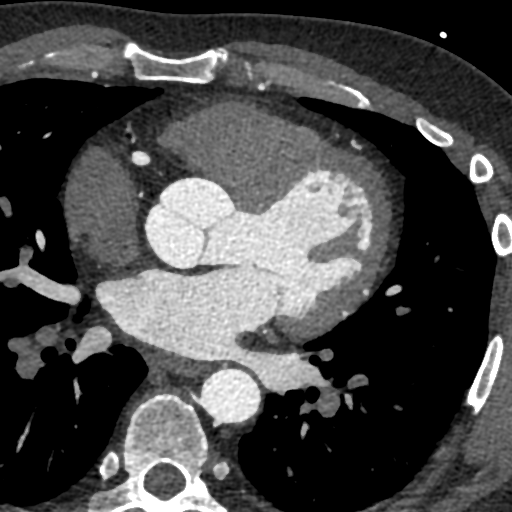

[Series 7: best syst 38 % · axial · 0.43mm/px · z∈[+864,+927]mm · 2 of 478 slices shown]
[im 160/478  vessel]
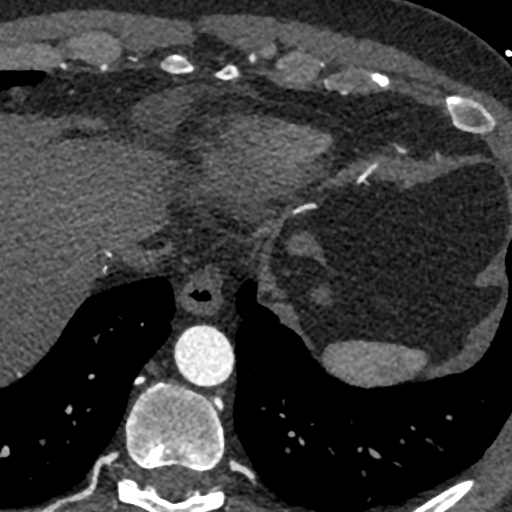
[im 319/478  vessel]
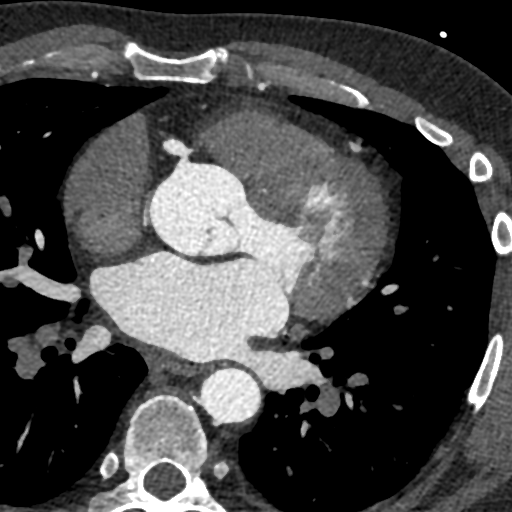

[Series 8: ts diast sharp 70 % · axial · 0.43mm/px · z∈[+864,+927]mm · 2 of 478 slices shown]
[im 160/478  lung]
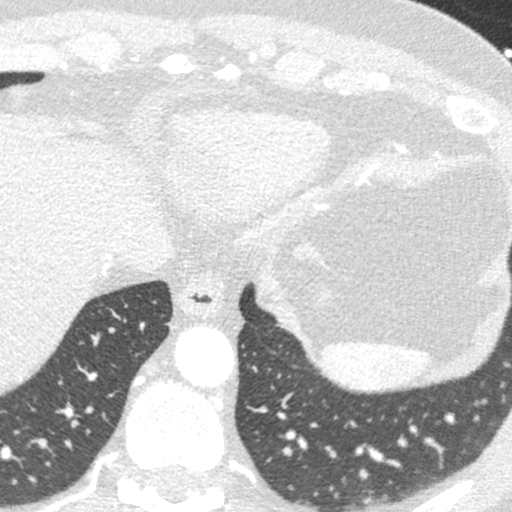
[im 319/478  lung]
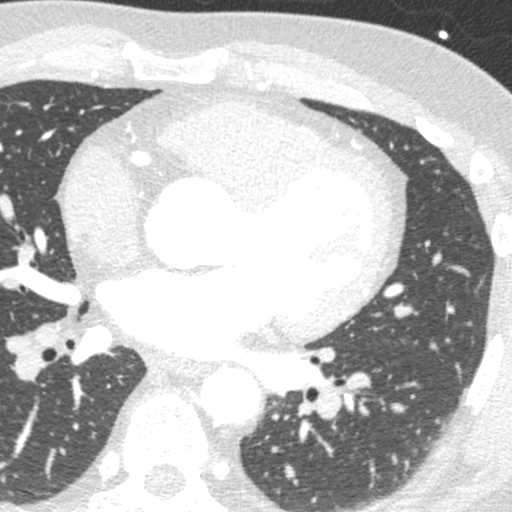

[Series 9: ts syst sharp 38 % · axial · 0.43mm/px · z∈[+864,+927]mm · 2 of 478 slices shown]
[im 160/478  lung]
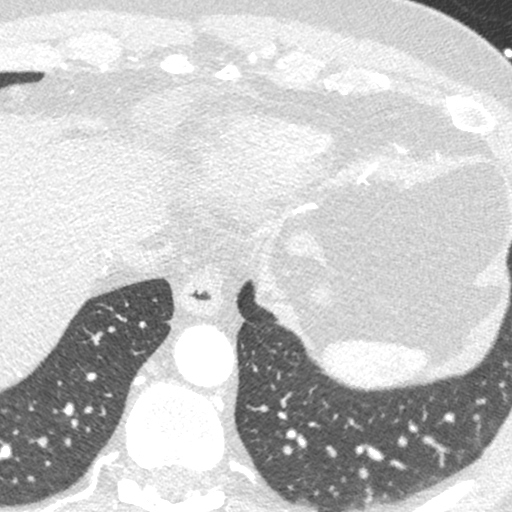
[im 319/478  lung]
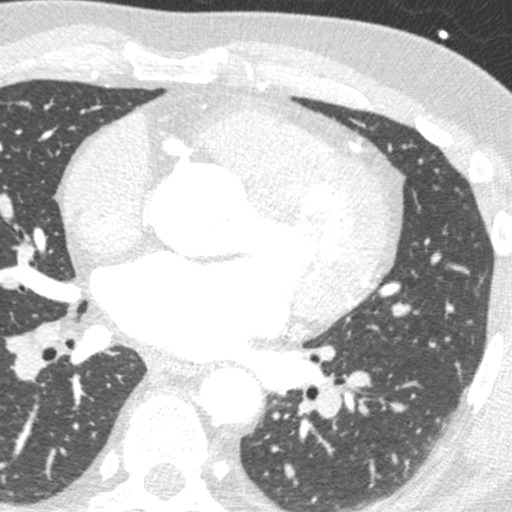

[8 of 20 positions shown; findings below may reference images not displayed]

FINDINGS: Limited view of the lung parenchyma demonstrates no suspicious
nodularity. Airways are normal.

Limited view of the mediastinum demonstrates no adenopathy.
Esophagus normal.

Limited view of the upper abdomen unremarkable.

Limited view of the skeleton and chest wall is unremarkable.
IMPRESSION: No significant extracardiac findings
FINDINGS: Non-cardiac: See separate report from [REDACTED]. No
significant findings on limited lung and soft tissue windows.

Calcium Score: No calcium detected

Coronary Arteries: Right dominant with no anomalies

LM: Normal

LAD: Normal

D1: Normal

D2: Normal

D3: Normal

Circumflex: Normal

OM1: Normal

OM2: Normal

OM3: Normal

RCA: Normal

PDA: Normal

PLA: Normal
IMPRESSION: 1. Calcium Score 0

2.  Normal right dominant coronary arteries

3.  Normal aortic root 3.7 cm

Jeroby Garas

## 2021-10-24 ENCOUNTER — Encounter: Payer: Self-pay | Admitting: Neurology

## 2021-10-24 ENCOUNTER — Ambulatory Visit: Payer: BC Managed Care – PPO | Admitting: Neurology

## 2021-10-24 VITALS — BP 160/111 | HR 105 | Ht 70.0 in | Wt 248.6 lb

## 2021-10-24 DIAGNOSIS — F5104 Psychophysiologic insomnia: Secondary | ICD-10-CM | POA: Diagnosis not present

## 2021-10-24 DIAGNOSIS — Z9989 Dependence on other enabling machines and devices: Secondary | ICD-10-CM | POA: Insufficient documentation

## 2021-10-24 DIAGNOSIS — Z6835 Body mass index (BMI) 35.0-35.9, adult: Secondary | ICD-10-CM | POA: Diagnosis not present

## 2021-10-24 NOTE — Patient Instructions (Signed)
KEEPING IT CLEAN: CPAP HYGIENE PROPER UPKEEP OF YOUR CPAP MACHINE CAN HELP ENSURE THE DEVICE FUNCTIONS PROPERLY CPAP CLEANING INSTRUCTIONS Along with proper CPAP cleaning it is recommended that you replace your mask, tubing and filters once very 3 months and more frequently if you are sick.   DAILY CLEANING Do not use moisturizing soaps, bleach, scented oils, chlorine, or alcohol-based solutions to clean your supplies. These solutions may cause irritation to your skin and lungs and may reduce the life of your products. [employer] or Comparable works best for daily cleaning.  **If you've been sick, it's smart to wash your mask, tubing, humidifier and filter daily until your cold, flu or virus symptoms are gone. That can help reduce the amount of time you spend under the weather.  Before using your mask -wash your face daily with soap and water to remove excess facial oils. Wipe down your mask (including areas that come in contact with your skin) using a damp towel with soap and warm water. This will remove any oils, dead skin cells, and sweat on the mask that can affect the quality of the seal. Gently rinse with a clean towel and let the mask air-dry out of direct sunlight. You can also use unscented baby wipes or pre-moistened towels designed specifically for cleaning CPAP masks, which are available on-line. DO NOT USE CLOROX OR DISINFECTING WIPES. If your unit has a humidifier, empty any leftover water instead of letting in sit in the unit all day. Refill the humidifier with clean, distilled water right before bedtime for optimal use WEEKLY (OR MORE FREQUENT) CLEANING Your mask and tubing need a full bath at least once a week to keep it free of dust, bacteria, and germs. (During COVID-19 or any other flu/virus we recommend more frequent cleaning) Clean the CPAP tubing, nasal mask, and headgear in a bathroom sink filled with warm water and a few drops of ammonia-free, mild dish detergent. Avoid  using stronger cleaning products, as they may damage the mask or leave harmful residue. Swirl all parts around for about five minutes, rinse well and let air dry during the day. Hang the tubing over the shower rod, on a towel rack or in the laundry room to ensure all the water drips out. The mask and headgear can be air-dried on a towel or hung on a hook or hanger. You should also wipe down your CPAP machine with a damp cloth. Ensure the unit is unplugged. The towel shouldn't be too damp or wet, as water could get into the machine. Clean the filter by removing it and rinsing it in warm tap water. Run it under the water and squeeze to make sure there is no dust. Then blot down the filter with a towel. Do not wash your machine's white filter, if one is present--those are disposable and should be replaced every two weeks. If you are recovering from being sick, we recommend changing the filter sooner. If your CPAP has a humidifier, that also needs to be cleaned weekly. Empty any remaining water and then wash the water chamber in the sink with warm soapy water. Rinse well and drain out as much of the water as possible. Let the chamber air-dry before placing it back into the CPAP unit. Every other week you should disinfect the humidifier. Do that by soaking it in a solution of one-part vinegar to five parts water for 30 minutes, thoroughly rinsing and then placing in your dishwasher's top rack for washing. And keep  it clean by using only distilled water to prevent mineral deposits that can build up and cause damage to your machine. IMPORTANT TIPS Make caring for your CPAP equipment part of your morning routine. Keep machine and accessories out of direct sunlight to avoid damaging them. Never use bleach to clean accessories. Place machine on a level surface and away from curtains that may interfere with the air intake. Keep track of when you should order replacement parts for your mask and accessories so that  you always get the most out of your CPAP. You can also sign up for [employer] by contacting our DME department at [email or web address] **The following are examples of soap that may be used: [employer], [place] soap (plain).  With a little upkeep, your CPAP can continue to help you breathe better for a long time. Just a few minutes a day can help keep your CPAP running efficiently for years to come.  If you have a CPAP, but are struggling with compliance, check out our no mask oral appliance, for those with mild to moderate sleep apnea.  Call and schedule a consultation with one of our sleep medicine physicians, or ask your doctor about a sleep referral to the [facility].   CPAP and BIPAP Information CPAP and BIPAP are methods that use air pressure to keep your airways open and to help you breathe well. CPAP and BIPAP use different amounts of pressure. Your health care provider will tell you whether CPAP or BIPAP would be more helpful for you. CPAP stands for "continuous positive airway pressure." With CPAP, the amount of pressure stays the same while you breathe in (inhale) and out (exhale). BIPAP stands for "bi-level positive airway pressure." With BIPAP, the amount of pressure will be higher when you inhale and lower when you exhale. This allows you to take larger breaths. CPAP or BIPAP may be used in the hospital, or your health care provider may want you to use it at home. You may need to have a sleep study before your health care provider can order a machine for you to use at home. What are the advantages? CPAP or BIPAP can be helpful if you have: Sleep apnea. Chronic obstructive pulmonary disease (COPD). Heart failure. Medical conditions that cause muscle weakness, including muscular dystrophy or amyotrophic lateral sclerosis (ALS). Other problems that cause breathing to be shallow, weak, abnormal, or difficult. CPAP and BIPAP are most commonly  used for obstructive sleep apnea (OSA) to keep the airways from collapsing when the muscles relax during sleep. What are the risks? Generally, this is a safe treatment. However, problems may occur, including: Irritated skin or skin sores if the mask does not fit properly. Dry or stuffy nose or nosebleeds. Dry mouth. Feeling gassy or bloated. Sinus or lung infection if the equipment is not cleaned properly. When should CPAP or BIPAP be used? In most cases, the mask only needs to be worn during sleep. Generally, the mask needs to be worn throughout the night and during any daytime naps. People with certain medical conditions may also need to wear the mask at other times, such as when they are awake. Follow instructions from your health care provider about when to use the machine. What happens during CPAP or BIPAP?  Both CPAP and BIPAP are provided by a small machine with a flexible plastic tube that attaches to a plastic mask that you wear. Air is blown through the mask into your nose or mouth.  The amount of pressure that is used to blow the air can be adjusted on the machine. Your health care provider will set the pressure setting and help you find the best mask for you. Tips for using the mask Because the mask needs to be snug, some people feel trapped or closed-in (claustrophobic) when first using the mask. If you feel this way, you may need to get used to the mask. One way to do this is to hold the mask loosely over your nose or mouth and then gradually apply the mask more snugly. You can also gradually increase the amount of time that you use the mask. Masks are available in various types and sizes. If your mask does not fit well, talk with your health care provider about getting a different one. Some common types of masks include: Full face masks, which fit over the mouth and nose. Nasal masks, which fit over the nose. Nasal pillow or prong masks, which fit into the nostrils. If you are using  a mask that fits over your nose and you tend to breathe through your mouth, a chin strap may be applied to help keep your mouth closed. Use a skin barrier to protect your skin as told by your health care provider. Some CPAP and BIPAP machines have alarms that may sound if the mask comes off or develops a leak. If you have trouble with the mask, it is very important that you talk with your health care provider about finding a way to make the mask easier to tolerate. Do not stop using the mask. There could be a negative impact on your health if you stop using the mask. Tips for using the machine Place your CPAP or BIPAP machine on a secure table or stand near an electrical outlet. Know where the on/off switch is on the machine. Follow instructions from your health care provider about how to set the pressure on your machine and when you should use it. Do not eat or drink while the CPAP or BIPAP machine is on. Food or fluids could get pushed into your lungs by the pressure of the CPAP or BIPAP. For home use, CPAP and BIPAP machines can be rented or purchased through home health care companies. Many different brands of machines are available. Renting a machine before purchasing may help you find out which particular machine works well for you. Your health insurance company may also decide which machine you may get. Keep the CPAP or BIPAP machine and attachments clean. Ask your health care provider for specific instructions. Check the humidifier if you have a dry stuffy nose or nosebleeds. Make sure it is working correctly. Follow these instructions at home: Take over-the-counter and prescription medicines only as told by your health care provider. Ask if you can take sinus medicine if your sinuses are blocked. Do not use any products that contain nicotine or tobacco. These products include cigarettes, chewing tobacco, and vaping devices, such as e-cigarettes. If you need help quitting, ask your health care  provider. Keep all follow-up visits. This is important. Contact a health care provider if: You have redness or pressure sores on your head, face, mouth, or nose from the mask or head gear. You have trouble using the CPAP or BIPAP machine. You cannot tolerate wearing the CPAP or BIPAP mask. Someone tells you that you snore even when wearing your CPAP or BIPAP. Get help right away if: You have trouble breathing. You feel confused. Summary CPAP and BIPAP are  methods that use air pressure to keep your airways open and to help you breathe well. If you have trouble with the mask, it is very important that you talk with your health care provider about finding a way to make the mask easier to tolerate. Do not stop using the mask. There could be a negative impact to your health if you stop using the mask. Follow instructions from your health care provider about when to use the machine. This information is not intended to replace advice given to you by your health care provider. Make sure you discuss any questions you have with your health care provider. Document Revised: 08/23/2020 Document Reviewed: 12/24/2019 Elsevier Patient Education  2023 [employer] Insomnia Insomnia is a sleep disorder that makes it difficult to fall asleep or stay asleep. Insomnia can cause fatigue, low energy, difficulty concentrating, mood swings, and poor performance at work or school. There are three different ways to classify insomnia: Difficulty falling asleep. Difficulty staying asleep. Waking up too early in the morning. Any type of insomnia can be long-term (chronic) or short-term (acute). Both are common. Short-term insomnia usually lasts for 3 months or less. Chronic insomnia occurs at least three times a week for longer than 3 months. What are the causes? Insomnia may be caused by another condition, situation, or substance, such as: Having certain mental health conditions, such as anxiety and  depression. Using caffeine, alcohol, tobacco, or drugs. Having gastrointestinal conditions, such as gastroesophageal reflux disease (GERD). Having certain medical conditions. These include: Asthma. Alzheimer's disease. Stroke. Chronic pain. An overactive thyroid gland (hyperthyroidism). Other sleep disorders, such as restless legs syndrome and sleep apnea. Menopause. Sometimes, the cause of insomnia may not be known. What increases the risk? Risk factors for insomnia include: Gender. Females are affected more often than males. Age. Insomnia is more common as people get older. Stress and certain medical and mental health conditions. Lack of exercise. Having an irregular work schedule. This may include working night shifts and traveling between different time zones. What are the signs or symptoms? If you have insomnia, the main symptom is having trouble falling asleep or having trouble staying asleep. This may lead to other symptoms, such as: Feeling tired or having low energy. Feeling nervous about going to sleep. Not feeling rested in the morning. Having trouble concentrating. Feeling irritable, anxious, or depressed. How is this diagnosed? This condition may be diagnosed based on: Your symptoms and medical history. Your health care provider may ask about: Your sleep habits. Any medical conditions you have. Your mental health. A physical exam. How is this treated? Treatment for insomnia depends on the cause. Treatment may focus on treating an underlying condition that is causing the insomnia. Treatment may also include: Medicines to help you sleep. Counseling or therapy. Lifestyle adjustments to help you sleep better. Follow these instructions at home: Eating and drinking  Limit or avoid alcohol, caffeinated beverages, and products that contain nicotine and tobacco, especially close to bedtime. These can disrupt your sleep. Do not eat a large meal or eat spicy foods right  before bedtime. This can lead to digestive discomfort that can make it hard for you to sleep. Sleep habits  Keep a sleep diary to help you and your health care provider figure out what could be causing your insomnia. Write down: When you sleep. When you wake up during the night. How well you sleep and how rested you feel the next day. Any side effects of medicines you are taking. What  you eat and drink. Make your bedroom a dark, comfortable place where it is easy to fall asleep. Put up shades or blackout curtains to block light from outside. Use a white noise machine to block noise. Keep the temperature cool. Limit screen use before bedtime. This includes: Not watching TV. Not using your smartphone, tablet, or computer. Stick to a routine that includes going to bed and waking up at the same times every day and night. This can help you fall asleep faster. Consider making a quiet activity, such as reading, part of your nighttime routine. Try to avoid taking naps during the day so that you sleep better at night. Get out of bed if you are still awake after 15 minutes of trying to sleep. Keep the lights down, but try reading or doing a quiet activity. When you feel sleepy, go back to bed. General instructions Take over-the-counter and prescription medicines only as told by your health care provider. Exercise regularly as told by your health care provider. However, avoid exercising in the hours right before bedtime. Use relaxation techniques to manage stress. Ask your health care provider to suggest some techniques that may work well for you. These may include: Breathing exercises. Routines to release muscle tension. Visualizing peaceful scenes. Make sure that you drive carefully. Do not drive if you feel very sleepy. Keep all follow-up visits. This is important. Contact a health care provider if: You are tired throughout the day. You have trouble in your daily routine due to sleepiness. You  continue to have sleep problems, or your sleep problems get worse. Get help right away if: You have thoughts about hurting yourself or someone else. Get help right away if you feel like you may hurt yourself or others, or have thoughts about taking your own life. Go to your nearest emergency room or: Call 911. Call the [facility] at ([phone] or 988. This is open 24 hours a day. Text the Crisis Text Line at (934)169-3597. Summary Insomnia is a sleep disorder that makes it difficult to fall asleep or stay asleep. Insomnia can be long-term (chronic) or short-term (acute). Treatment for insomnia depends on the cause. Treatment may focus on treating an underlying condition that is causing the insomnia. Keep a sleep diary to help you and your health care provider figure out what could be causing your insomnia. This information is not intended to replace advice given to you by your health care provider. Make sure you discuss any questions you have with your health care provider. Document Revised: 12/25/2020 Document Reviewed: 12/25/2020 Elsevier Patient Education  [place].

## 2021-10-24 NOTE — Progress Notes (Signed)
SLEEP MEDICINE CLINIC    Provider:  Melvyn Novas, MD  Primary Care Physician:  Medicine, [facility] (Inactive) No address on file     Referring Provider: Maud Deed, [place] [address] [place],  [place] [address]          Chief Complaint according to patient   Patient presents with:     New Patient (Initial Visit)     PA  Maud Deed, [place]      HISTORY OF PRESENT ILLNESS:  Trevor Leblanc is a 46 y.o. Caucasian male patient seen here as a referral on 10/24/2021 from [place]  for a new SLEEP consultation.  .  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 He using a nasal pillow- SWIFT in medium. I have had the pleasure of seeing hi after a 15 year hiatus- and still can remember him. He works for [employer].     I have the pleasure of seeing Trevor Leblanc today, a right-handed White or Caucasian male with a History of OSA sleep disorder.  He  has a past medical history of Depression, High triglycerides, History of CT scan of chest, History of echocardiogram, History of stress test, and Sleep apnea. Had some Insomnia with pandemic stressors.    The patient had the first sleep study in the year 2007 or 2008  with a result of OSA.  Meanwhile his sn was born, now 87, he bought supplies on [employer]. First machine was issued by [facility] [employer].   Sleep relevant medical history: No Nocturia some Insomnia with Covid pandemic.  Tonsillectomy, cervical spine surgery/** deviated septum repair? UPPP?    Family medical /sleep history: NO other family member on CPAP with OSA, .    Social history:  Patient is working as a Air traffic controller, classroom -and lives in a household with 4 persons. Family status is married, with children, 2 cats.  The patient currently works. Tobacco use; none .  ETOH use: rare. Caffeine intake  in form of Coffee( 2-3 cups a day, not past 3 Pm) Soda( /) Tea ( yes ), also  chocolate , no energy drinks. Regular exercise in season. Hobbies; walking on treadmill..30 Reading, video games.    Sleep habits are as follows: The patient's dinner time is between 7-.7.45 PM. The patient goes to bed at 10.30 PM and sometimes struggles to sleep : 30-45 minutes.  continues to sleep for 6-7.5 hours, wakes not for bathroom breaks.   The preferred sleep position is supine , with the support of 1-2 pillows. Cats are in bed.   Dreams are reportedly infrequent/vivid. He reports some arthritis pain.  6.30   AM is the usual rise time. The patient wakes up spontaneously when the sun rises, in Winter with an alarm.  He  reports not feeling refreshed or restored in AM, with symptoms such as dry mouth, morning headaches, and residual fatigue.  Naps are taken infrequently, lasting from 15 to 30 minutes and are more refreshing than nocturnal sleep.    Review of Systems: Out of a complete 14 system review, the patient complains of only the following symptoms, and all other reviewed systems are negative.:  Fatigue, sleepiness , snoring, fragmented sleep, Insomnia some times.    How likely are you to doze in the following  situations: 0 = not likely, 1 = slight chance, 2 = moderate chance, 3 = high chance   Sitting and Reading? Watching Television? Sitting inactive in a public place (theater or meeting)? As a passenger in a car for an hour without a break? Lying down in the afternoon when circumstances permit? Sitting and talking to someone? Sitting quietly after lunch without alcohol? In a car, while stopped for a few minutes in traffic?   Total = 9/ 24 points   FSS endorsed at 35/ 63 points.   Social History   Socioeconomic History   Marital status: Married    Spouse name: Not on file   Number of children: 1   Years of education: Not on file   Highest education level: Not on file  Occupational  History   Occupation: High School English Teacher    Employer: [employer]    Comment: [employer]  Tobacco Use   Smoking status: Former    Types: Cigarettes    Quit date: 11/30/2013    Years since quitting: 7.9   Smokeless tobacco: Never  Vaping Use   Vaping Use: Never used  Substance and Sexual Activity   Alcohol use: Yes    Alcohol/week: 10.0 standard drinks of alcohol    Types: 10 Standard drinks or equivalent per week   Drug use: No   Sexual activity: Not on file  Other Topics Concern   Not on file  Social History Narrative   Living in [place] area since late 1990s   Social Determinants of Health   Financial Resource Strain: Not on file  Food Insecurity: Not on file  Transportation Needs: Not on file  Physical Activity: Not on file  Stress: Not on file  Social Connections: Not on file    Family History  Problem Relation Age of Onset   Hypertension Mother    Depression Mother        suicide   Hyperlipidemia Father    Diabetes Maternal Grandmother    Diabetes Paternal Grandmother    Cancer Paternal Grandfather    CAD Other        great grandmother    Past Medical History:  Diagnosis Date   Depression    High triglycerides    History of CT scan of chest    Coronary CTA 7/19: Coronary arteries, calcium score 0   History of echocardiogram    Echo 7/19: EF 50, possible mild hypokinesis of inferolateral and inferior myocardium (consistent with ischemia in the distribution of the RCA or LCx), grade 1 diastolic dysfunction   History of stress test    GXT 7/19: Exercised 9 minutes 41 seconds, no chest pain, no ST changes   Sleep apnea    on CPAP (previously saw Dr. Brett Fairy)    Past Surgical History:  Procedure Laterality Date   lesion excised from finger       Current Outpatient Medications on File Prior to Visit  Medication Sig Dispense Refill   Multiple Vitamins-Minerals (MULTIVITAMIN WITH MINERALS) tablet Take 1 tablet by mouth daily.      Omega-3 Fatty Acids (FISH OIL PO) Take 1 capsule by mouth daily.     metoprolol tartrate (LOPRESSOR) 50 MG tablet Take 1 tablet (50 mg total) by mouth once for 1 dose. 1 tablet 0   No current facility-administered medications on file prior to visit.    Allergies  Allergen Reactions   Penicillins Anaphylaxis    Physical exam:  Today's Vitals   10/24/21 1455  BP: (!) 160/111  Pulse: (!) 105  Weight: 248 lb 9.6 oz (112.8 kg)  Height: 5\' 10"  (1.778 m)   Body mass index is 35.67 kg/m.   Wt Readings from Last 3 Encounters:  10/24/21 248 lb 9.6 oz (112.8 kg)  07/23/17 240 lb (108.9 kg)  06/12/17 235 lb (106.6 kg)     Ht Readings from Last 3 Encounters:  10/24/21 5\' 10"  (1.778 m)  07/23/17 5\' 10"  (1.778 m)  06/12/17 5\' 10"  (1.778 m)      General: The patient is awake, alert and appears not in acute distress. The patient is well groomed. Head: Normocephalic, atraumatic.  Neck is supple. Mallampati 3,  neck circumference:18.5 inches . Nasal airflow  patent.  Retrognathia is not seen. But mandible is small , and teeth ar crowded.   Dental status: biological  Cardiovascular:  Regular rate and cardiac rhythm by pulse,  without distended neck veins. Respiratory: Lungs are clear to auscultation.  Skin:  Without evidence of ankle edema, or rash. Trunk: The patient's posture is erect.   Neurologic exam : The patient is awake and alert, oriented to place and time.   Memory subjective described as intact.  Attention span & concentration ability appears normal.  Speech is fluent,  without  dysarthria, dysphonia or aphasia.  Mood and affect are appropriate.   Cranial nerves: no loss of smell or taste reported  Pupils are equal and briskly reactive to light. Funduscopic exam deferred..  Extraocular movements in vertical and horizontal planes were intact and without nystagmus. No Diplopia. Visual fields by finger perimetry are intact. Hearing was intact to soft voice and finger  rubbing.    Facial sensation intact to fine touch.  Facial motor strength is symmetric and tongue and uvula move midline.  Neck ROM : rotation, tilt and flexion extension were normal for age and shoulder shrug was symmetrical.    Motor exam:  Symmetric bulk, tone and ROM.   Normal tone without cog wheeling, symmetric grip strength .   Sensory:  Fine touch, pinprick and vibration were tested  and  normal.  Proprioception tested in the upper extremities was normal.   Coordination: Rapid alternating movements in the fingers/hands were of normal speed.  The Finger-to-nose maneuver was intact without evidence of ataxia, dysmetria or tremor.   Gait and station: Patient could rise unassisted from a seated position, walked without assistive device.  Stance is of normal width/ base and the patient turned with 3 steps.  Toe and heel walk were deferred.  Deep tendon reflexes: in the  upper and lower extremities are symmetric and intact.  Babinski response was deferred .        After spending a total time of  45  minutes face to face and additional time for physical and neurologic examination, review of laboratory studies,  personal review of imaging studies, reports and results of other testing and review of referral information / records as far as provided in visit, I have established the following assessments:  I will try to gather the older records form 15 years ago, Trevor Leblanc was diagnosed here with Mixed apnea and uses a CPAP ever since.  I tried to connect his machine , there is a 5 minutes ramp,  5 - X cm water.   1)  mixed apnea. 2)  snoring 3)  smaller mandible, and BMI are risk factors.    My Plan is to proceed with:  1) either attended sleep study , SPLIT at  AHI 10/h with mask of his choice or HST and auto CPAP>  2) melatonin for insomnia.    In short, Trevor Leblanc is presenting with the need for a new baseline PSG and a new CPAP .  I plan to follow up either personally or  through our NP within 2-4 months.   I would like to thank Medicine, [place] (Inactive) and [place], [place], [place],  [place] [address] for allowing me to meet with and to take care of this pleasant patient.    CC: I will share my notes with PCP. Marland Kitchen  Electronically signed by: Larey Seat, MD 10/24/2021 3:24 PM  [facility] Board certified by The [employer] of Sleep Medicine and Diplomate of the [employer] of Sleep Medicine. Board certified In Neurology through the [place], Fellow of the [employer] of Neurology. Medical Director of [facility].

## 2021-11-12 ENCOUNTER — Encounter: Payer: Self-pay | Admitting: Neurology

## 2021-11-14 ENCOUNTER — Ambulatory Visit: Payer: BC Managed Care – PPO | Admitting: Neurology

## 2021-11-14 DIAGNOSIS — F5104 Psychophysiologic insomnia: Secondary | ICD-10-CM

## 2021-11-14 DIAGNOSIS — G4733 Obstructive sleep apnea (adult) (pediatric): Secondary | ICD-10-CM

## 2021-11-14 DIAGNOSIS — Z9989 Dependence on other enabling machines and devices: Secondary | ICD-10-CM

## 2021-11-14 DIAGNOSIS — Z6835 Body mass index (BMI) 35.0-35.9, adult: Secondary | ICD-10-CM

## 2021-11-27 NOTE — Progress Notes (Signed)
[facility] TEST REPORT ( by Watch PAT)   STUDY DATE:  MAIL OUT- 11-27-2021   ORDERING CLINICIAN: Larey Seat, MD  REFERRING CLINICIAN: PA Adolm Joseph, [place]   CLINICAL INFORMATION/HISTORY: Established 46 year-old male CPAP user, needing a new machine. Referred on 10-24-2021 .      Epworth sleepiness score: 9 /24. FSS at  35/63.    BMI:  35.7 kg/m   Neck Circumference: 19"   FINDINGS:   Sleep Summary:   Total Recording Time (hours, min): 7 hours and 5 minutes       Total Sleep Time (hours, min): 5 hours 59 minutes               Percent REM (%): 21.8%                                      Respiratory Indices:   Calculated pAHI (per hour): 37/h                           REM pAHI:    35/h                                             NREM pAHI: 38/h                            Supine AHI:   55.5/h, this was followed by 48.6/h in prone sleep.  Nonsupine sleep was associated with an AHI of 28.4/h.                                                Oxygen Saturation Statistics:      O2 Saturation Range (%): Between a nadir of 75 and a maximum of 99%                                     O2 Saturation (minutes) <89%:    6.8 minutes       Pulse Rate Statistics:   Pulse Mean (bpm): 79 bpm               Pulse Range:   Between 51 and 107 beats per minute              IMPRESSION:  This HST confirms the presence of severe obstructive sleep apnea and the need for continued positive airway pressure therapy.   RECOMMENDATION: Mr. Aument is currently using a escape to machine and he considers himself CPAP dependent he uses a Swift mask and medium size and has been on the same machine for 15 years.  This will be replaced with an auto- titration capable ResMed device with a setting between 5 and 18 cmH2O pressure 2 cm EPR, heated humidification and mask of choice.    INTERPRETING PHYSICIAN:   Larey Seat, MD   Medical Director of  [facility].

## 2021-12-02 ENCOUNTER — Encounter: Payer: Self-pay | Admitting: Neurology

## 2021-12-02 NOTE — Addendum Note (Signed)
Addended by: Larey Seat on: 12/02/2021 07:51 AM   Modules accepted: Orders

## 2021-12-02 NOTE — Procedures (Signed)
[facility]   HOME SLEEP TEST REPORT ( by Watch PAT)   STUDY DATE:  MAIL OUT- 11-27-2021   ORDERING CLINICIAN: Allex Lapoint, MD  REFERRING CLINICIAN: PA [place], [facility] [address]   CLINICAL INFORMATION/HISTORY: Established 46 year-old male CPAP user, needing a new machine. Referred on 10-24-2021 .      Epworth sleepiness score: 9 /24. FSS at  35/63.    BMI:  35.7 kg/m   Neck Circumference: 19"   FINDINGS:   Sleep Summary:   Total Recording Time (hours, min): 7 hours and 5 minutes       Total Sleep Time (hours, min): 5 hours 59 minutes               Percent REM (%): 21.8%                                      Respiratory Indices:   Calculated pAHI (per hour): 37/h                           REM pAHI:    35/h                                             NREM pAHI: 38/h                            Supine AHI:   55.5/h, this was followed by 48.6/h in prone sleep.  Nonsupine sleep was associated with an AHI of 28.4/h.                                                Oxygen Saturation Statistics:      O2 Saturation Range (%): Between a nadir of 75 and a maximum of 99%                                     O2 Saturation (minutes) <89%:    6.8 minutes       Pulse Rate Statistics:   Pulse Mean (bpm): 79 bpm               Pulse Range:   Between 51 and 107 beats per minute              IMPRESSION:  This HST confirms the presence of severe obstructive sleep apnea and the need for continued positive airway pressure therapy.   RECOMMENDATION: Mr. Akel is currently using a escape to machine and he considers himself CPAP dependent he uses a Swift mask and medium size and has been on the same machine for 15 years.  This will be replaced with an auto- titration capable ResMed device with a setting between 5 and 18 cmH2O pressure 2 cm EPR, heated humidification and mask of choice.    INTERPRETING PHYSICIAN:   Zarius Furr, MD   Medical Director of  [facility].                     

## 2021-12-30 ENCOUNTER — Encounter: Payer: Self-pay | Admitting: Neurology

## 2022-02-19 ENCOUNTER — Ambulatory Visit: Payer: BC Managed Care – PPO | Admitting: Adult Health

## 2022-02-19 NOTE — Progress Notes (Unsigned)
[facility] [address] [place] [place] [address] [phone]       OFFICE FOLLOW UP NOTE  [name] Date of Birth:  03/31/1975 Medical Record Number:  [id number]   Reason for visit: Initial CPAP follow-up    SUBJECTIVE:   CHIEF COMPLAINT:  No chief complaint on file.   HPI:   Update 02/20/2022 JM: Patient returns for initial CPAP compliance visit.  Completed sleep study 11/14/2021 which showed severe sleep apnea with total AHI of 37/h.  Received new AutoPap machine 12/25/2021.       Consult visit 10/24/2021 Dr. Brett Fairy: Trevor Leblanc is a 47 y.o. Caucasian male patient seen here as a referral on 10/24/2021 from [place]  for a new SLEEP consultation.  .  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 He using a nasal pillow- SWIFT in medium. I have had the pleasure of seeing hi after a 15 year hiatus- and still can remember him. He works for [employer].      I have the pleasure of seeing Trevor Leblanc today, a right-handed White or Caucasian male with a History of OSA sleep disorder.  He  has a past medical history of Depression, High triglycerides, History of CT scan of chest, History of echocardiogram, History of stress test, and Sleep apnea. Had some Insomnia with pandemic stressors.    The patient had the first sleep study in the year 2007 or 2008  with a result of OSA.  Meanwhile his sn was born, now 1, he bought supplies on [employer]. First machine was issued by [facility].   Sleep relevant medical history: No Nocturia some Insomnia with Covid pandemic.  Tonsillectomy, cervical spine surgery/** deviated septum repair? UPPP?    Family medical /sleep history: NO other family member on CPAP with OSA, .    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 Family status is married, with children, 2 cats.  The patient currently works. Tobacco use; none .  ETOH use: rare. Caffeine intake in form of Coffee( 2-3 cups a day, not past 3 Pm) Soda( /) Tea ( yes ), also  chocolate , no energy drinks. Regular exercise in season. Hobbies; walking on treadmill..30 Reading, video games.    Sleep habits are as follows: The patient's dinner time is between 7-.7.45 PM. The patient goes to bed at 10.30 PM and sometimes struggles to sleep : 30-45 minutes.  continues to sleep for 6-7.5 hours, wakes not for bathroom breaks.   The preferred sleep position is supine , with the support of 1-2 pillows. Cats are in bed.   Dreams are reportedly infrequent/vivid. He reports some arthritis pain.  6.30   AM is the usual rise time. The patient wakes up spontaneously when the sun rises, in Winter with an alarm.  He  reports not feeling refreshed or restored in AM, with symptoms such as dry mouth, morning headaches, and residual fatigue.  Naps are taken infrequently, lasting from 15 to 30 minutes and are more refreshing than nocturnal sleep.      ROS:   14 system review of systems performed and negative with exception of those listed in HPI  PMH:  Past Medical History:  Diagnosis Date   Depression  High triglycerides    History of CT scan of chest    Coronary CTA 7/19: Coronary arteries, calcium score 0   History of echocardiogram    Echo 7/19: EF 50, possible mild hypokinesis of inferolateral and inferior myocardium (consistent with ischemia in the distribution of the RCA or LCx), grade 1 diastolic dysfunction   History of stress test    GXT 7/19: Exercised 9 minutes 41 seconds, no chest pain, no ST changes   Sleep apnea    on CPAP (previously saw Dr. Vickey Huger)    PSH:  Past Surgical History:  Procedure Laterality Date   lesion excised from finger      Social History:  Social History   Socioeconomic History   Marital status: Married     Spouse name: Not on file   Number of children: 1   Years of education: Not on file   Highest education level: Not on file  Occupational History   Occupation: High School Retail buyer    Employer: [employer]    Comment: [employer]  Tobacco Use   Smoking status: Former    Types: Cigarettes    Quit date: 11/30/2013    Years since quitting: 8.2   Smokeless tobacco: Never  Vaping Use   Vaping Use: Never used  Substance and Sexual Activity   Alcohol use: Yes    Alcohol/week: 10.0 standard drinks of alcohol    Types: 10 Standard drinks or equivalent per week   Drug use: No   Sexual activity: Not on file  Other Topics Concern   Not on file  Social History Narrative   Living in [place] area since late 1990s   Social Determinants of Health   Financial Resource Strain: Not on file  Food Insecurity: Not on file  Transportation Needs: Not on file  Physical Activity: Not on file  Stress: Not on file  Social Connections: Not on file  Intimate Partner Violence: Not on file    Family History:  Family History  Problem Relation Age of Onset   Hypertension Mother    Depression Mother        suicide   Hyperlipidemia Father    Diabetes Maternal Grandmother    Diabetes Paternal Grandmother    Cancer Paternal Grandfather    CAD Other        great grandmother    Medications:   Current Outpatient Medications on File Prior to Visit  Medication Sig Dispense Refill   metoprolol tartrate (LOPRESSOR) 50 MG tablet Take 1 tablet (50 mg total) by mouth once for 1 dose. 1 tablet 0   Multiple Vitamins-Minerals (MULTIVITAMIN WITH MINERALS) tablet Take 1 tablet by mouth daily.     Omega-3 Fatty Acids (FISH OIL PO) Take 1 capsule by mouth daily.     No current facility-administered medications on file prior to visit.    Allergies:   Allergies  Allergen Reactions   Penicillins Anaphylaxis      OBJECTIVE:  Physical Exam  There were no vitals filed for this visit. There  is no height or weight on file to calculate BMI. No results found.   General: well developed, well nourished, seated, in no evident distress Head: head normocephalic and atraumatic.   Neck: supple with no carotid or supraclavicular bruits Cardiovascular: regular rate and rhythm, no murmurs Musculoskeletal: no deformity Skin:  no rash/petichiae Vascular:  Normal pulses all extremities   Neurologic Exam Mental Status: Awake and fully alert. Oriented to place and time. Recent and  remote memory intact. Attention span, concentration and fund of knowledge appropriate. Mood and affect appropriate.  Cranial Nerves: Pupils equal, briskly reactive to light. Extraocular movements full without nystagmus. Visual fields full to confrontation. Hearing intact. Facial sensation intact. Face, tongue, palate moves normally and symmetrically.  Motor: Normal bulk and tone. Normal strength in all tested extremity muscles Sensory.: intact to touch , pinprick , position and vibratory sensation.  Coordination: Rapid alternating movements normal in all extremities. Finger-to-nose and heel-to-shin performed accurately bilaterally. Gait and Station: Arises from chair without difficulty. Stance is normal. Gait demonstrates normal stride length and balance without use of AD. Tandem walk and heel toe without difficulty.  Reflexes: 1+ and symmetric. Toes downgoing.         ASSESSMENT/PLAN: KYCEN SPALLA is a 47 y.o. year old male    OSA on CPAP : Compliance report shows satisfactory usage with optimal residual AHI.  Discussed continued nightly usage with ensuring greater than 4 hours nightly for optimal benefit and per insurance purposes.  Continue to follow with DME company for any needed supplies or CPAP related concerns     Follow up in *** or call earlier if needed   CC:  PCP: [facility] (Inactive)    I spent *** minutes of face-to-face and non-face-to-face time with  patient.  This included previsit chart review, lab review, study review, order entry, electronic health record documentation, patient education regarding diagnosis of sleep apnea with review and discussion of compliance report and answered all other questions to patient's satisfaction   Frann Rider, [facility]  [facility] [address] [place], [place] [address]  Phone [phone] Fax [phone] Note: This document was prepared with digital dictation and possible smart phrase technology. Any transcriptional errors that result from this process are unintentional.

## 2022-02-20 ENCOUNTER — Encounter: Payer: Self-pay | Admitting: Adult Health

## 2022-02-20 ENCOUNTER — Ambulatory Visit: Payer: BC Managed Care – PPO | Admitting: Adult Health

## 2022-02-20 VITALS — BP 155/92 | HR 85 | Ht 70.0 in | Wt 249.0 lb

## 2022-02-20 DIAGNOSIS — G4733 Obstructive sleep apnea (adult) (pediatric): Secondary | ICD-10-CM

## 2022-02-21 ENCOUNTER — Telehealth: Payer: Self-pay

## 2022-04-08 ENCOUNTER — Telehealth: Payer: BC Managed Care – PPO | Admitting: Physician Assistant

## 2022-04-08 DIAGNOSIS — M62838 Other muscle spasm: Secondary | ICD-10-CM | POA: Diagnosis not present

## 2022-04-08 DIAGNOSIS — S39012A Strain of muscle, fascia and tendon of lower back, initial encounter: Secondary | ICD-10-CM | POA: Diagnosis not present

## 2022-04-08 MED ORDER — IBUPROFEN 800 MG PO TABS: 800.0000 mg | ORAL_TABLET | Freq: Three times a day (TID) | ORAL | 0 refills | Status: DC | PRN

## 2022-04-08 MED ORDER — CYCLOBENZAPRINE HCL 10 MG PO TABS: 5.0000 mg | ORAL_TABLET | Freq: Three times a day (TID) | ORAL | 0 refills | Status: DC | PRN

## 2022-04-08 NOTE — Patient Instructions (Signed)
Trevor Leblanc, thank you for joining Mar Daring, PA-C for today's virtual visit.  While this provider is not your primary care provider (PCP), if your PCP is located in our provider database this encounter information will be shared with them immediately following your visit.   [facility] account gives you access to today's visit and all your visits, tests, and labs performed at [facility] " click here if you don't have a [facility] account or go to [email or web address]  Consent: (Patient) Trevor Leblanc provided verbal consent for this virtual visit at the beginning of the encounter.  Current Medications:  Current Outpatient Medications:    cyclobenzaprine (FLEXERIL) 10 MG tablet, Take 0.5-1 tablets (5-10 mg total) by mouth 3 (three) times daily as needed., Disp: 30 tablet, Rfl: 0   ibuprofen (ADVIL) 800 MG tablet, Take 1 tablet (800 mg total) by mouth every 8 (eight) hours as needed., Disp: 30 tablet, Rfl: 0   Multiple Vitamins-Minerals (MULTIVITAMIN WITH MINERALS) tablet, Take 1 tablet by mouth daily., Disp: , Rfl:    Omega-3 Fatty Acids (FISH OIL PO), Take 1 capsule by mouth daily., Disp: , Rfl:    Medications ordered in this encounter:  Meds ordered this encounter  Medications   ibuprofen (ADVIL) 800 MG tablet    Sig: Take 1 tablet (800 mg total) by mouth every 8 (eight) hours as needed.    Dispense:  30 tablet    Refill:  0    Order Specific Question:   Supervising Provider    Answer:   Chase Picket [id number]   cyclobenzaprine (FLEXERIL) 10 MG tablet    Sig: Take 0.5-1 tablets (5-10 mg total) by mouth 3 (three) times daily as needed.    Dispense:  30 tablet    Refill:  0    Order Specific Question:   Supervising Provider    Answer:   Chase Picket [id number]     *If you need refills on other medications prior to your next appointment, please contact your pharmacy*  Follow-Up: Call back or seek an in-person evaluation  if the symptoms worsen or if the condition fails to improve as anticipated.  [place] [phone]  Other Instructions  Back Exercises The following exercises strengthen the muscles that help to support the trunk (torso) and back. They also help to keep the lower back flexible. Doing these exercises can help to prevent or lessen existing low back pain. If you have back pain or discomfort, try doing these exercises 2-3 times each day or as told by your health care provider. As your pain improves, do them once each day, but increase the number of times that you repeat the steps for each exercise (do more repetitions). To prevent the recurrence of back pain, continue to do these exercises once each day or as told by your health care provider. Do exercises exactly as told by your health care provider and adjust them as directed. It is normal to feel mild stretching, pulling, tightness, or discomfort as you do these exercises, but you should stop right away if you feel sudden pain or your pain gets worse. Exercises Single knee to chest Repeat these steps 3-5 times for each leg: Lie on your back on a firm bed or the floor with your legs extended. Bring one knee to your chest. Your other leg should stay extended and in contact with the floor. Hold your knee in place by grabbing your knee or  thigh with both hands and hold. Pull on your knee until you feel a gentle stretch in your lower back or buttocks. Hold the stretch for 10-30 seconds. Slowly release and straighten your leg.  Pelvic tilt Repeat these steps 5-10 times: Lie on your back on a firm bed or the floor with your legs extended. Bend your knees so they are pointing toward the ceiling and your feet are flat on the floor. Tighten your lower abdominal muscles to press your lower back against the floor. This motion will tilt your pelvis so your tailbone points up toward the ceiling instead of pointing to your feet or the  floor. With gentle tension and even breathing, hold this position for 5-10 seconds.  Cat-cow Repeat these steps until your lower back becomes more flexible: Get into a hands-and-knees position on a firm bed or the floor. Keep your hands under your shoulders, and keep your knees under your hips. You may place padding under your knees for comfort. Let your head hang down toward your chest. Contract your abdominal muscles and point your tailbone toward the floor so your lower back becomes rounded like the back of a cat. Hold this position for 5 seconds. Slowly lift your head, let your abdominal muscles relax, and point your tailbone up toward the ceiling so your back forms a sagging arch like the back of a cow. Hold this position for 5 seconds.  Press-ups Repeat these steps 5-10 times: Lie on your abdomen (face-down) on a firm bed or the floor. Place your palms near your head, about shoulder-width apart. Keeping your back as relaxed as possible and keeping your hips on the floor, slowly straighten your arms to raise the top half of your body and lift your shoulders. Do not use your back muscles to raise your upper torso. You may adjust the placement of your hands to make yourself more comfortable. Hold this position for 5 seconds while you keep your back relaxed. Slowly return to lying flat on the floor.  Bridges Repeat these steps 10 times: Lie on your back on a firm bed or the floor. Bend your knees so they are pointing toward the ceiling and your feet are flat on the floor. Your arms should be flat at your sides, next to your body. Tighten your buttocks muscles and lift your buttocks off the floor until your waist is at almost the same height as your knees. You should feel the muscles working in your buttocks and the back of your thighs. If you do not feel these muscles, slide your feet 1-2 inches (2.5-5 cm) farther away from your buttocks. Hold this position for 3-5 seconds. Slowly lower  your hips to the starting position, and allow your buttocks muscles to relax completely. If this exercise is too easy, try doing it with your arms crossed over your chest. Abdominal crunches Repeat these steps 5-10 times: Lie on your back on a firm bed or the floor with your legs extended. Bend your knees so they are pointing toward the ceiling and your feet are flat on the floor. Cross your arms over your chest. Tip your chin slightly toward your chest without bending your neck. Tighten your abdominal muscles and slowly raise your torso high enough to lift your shoulder blades a tiny bit off the floor. Avoid raising your torso higher than that because it can put too much stress on your lower back and does not help to strengthen your abdominal muscles. Slowly return to your starting  position.  Back lifts Repeat these steps 5-10 times: Lie on your abdomen (face-down) with your arms at your sides, and rest your forehead on the floor. Tighten the muscles in your legs and your buttocks. Slowly lift your chest off the floor while you keep your hips pressed to the floor. Keep the back of your head in line with the curve in your back. Your eyes should be looking at the floor. Hold this position for 3-5 seconds. Slowly return to your starting position.  Contact a health care provider if: Your back pain or discomfort gets much worse when you do an exercise. Your worsening back pain or discomfort does not lessen within 2 hours after you exercise. If you have any of these problems, stop doing these exercises right away. Do not do them again unless your health care provider says that you can. Get help right away if: You develop sudden, severe back pain. If this happens, stop doing the exercises right away. Do not do them again unless your health care provider says that you can. This information is not intended to replace advice given to you by your health care provider. Make sure you discuss any  questions you have with your health care provider. Document Revised: 07/11/2020 Document Reviewed: 03/29/2020 Elsevier Patient Education  [place].    If you have been instructed to have an in-person evaluation today at a local Urgent Care facility, please use the link below. It will take you to a list of all of our available [facility], including address, phone number and hours of operation. Please do not delay care.  [facility]  If you or a family member do not have a primary care provider, use the link below to schedule a visit and establish care. When you choose a [facility] primary care physician or advanced practice provider, you gain a long-term partner in health. Find a Primary Care Provider  Learn more about [facility] in-office and virtual care options: [facility]
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
## 2023-02-25 ENCOUNTER — Encounter: Payer: Self-pay | Admitting: *Deleted

## 2023-02-25 NOTE — Progress Notes (Unsigned)
[facility] [address] [place]. [place] [address] [phone]       OFFICE FOLLOW UP NOTE  [name] Date of Birth:  [birth year] Medical Record Number:  [id number]   Primary neurologist: Dr. Vickey Huger Reason for visit: CPAP follow-up   Virtual Visit via Video Note  I connected with Meriam Sprague on 02/25/23 at  8:45 AM EST by a video enabled telemedicine application and verified that I am speaking with the correct person using two identifiers.  Location: Patient: at home Provider: in office, [facility]   I discussed the limitations of evaluation and management by telemedicine and the availability of in person appointments. The patient expressed understanding and agreed to proceed.     SUBJECTIVE:   Follow-up visit:  Prior visit: 02/20/2022  Brief HPI:   Trevor Leblanc is a 48 y.o. male who was evaluated by Dr. Vickey Huger in 09/2021 for history of sleep apnea previously diagnosed by Dr. Vickey Huger over 15 years ago but lost to follow-up.  HST 10/2021 confirmed presence of severe OSA with total AHI of 37/h and supine AHI 55.5/h and recommended continuation of AutoPap therapy.  Received new AutoPap machine 11/2021.  At prior visit, compliance report showed excellent usage and optimal residual AHI.  Continued current pressure settings.    Interval history:  Returns for yearly CPAP visit via MyChart video visit.  Compliance report as below showing excellent usage and optimal residual AHI.  Continues to tolerate his CPAP without difficulty.  He did have a recent gout flare which caused issues with his gait and ended up throwing his back out, he had some difficulty sleeping during that time but has been gradually improving after use of steroid taper pack and muscle relaxants as needed.  Typically, he is able to sleep well with CPAP and has good daytime energy levels.  Followed by DME [facility], has purchased some supplies out-of-pocket  as this was cheaper and obtaining through insurance.  No questions or concerns at this time.      ROS:   14 system review of systems performed and negative with exception of those listed in HPI  PMH:  Past Medical History:  Diagnosis Date   Depression    High triglycerides    History of CT scan of chest    Coronary CTA 7/19: Coronary arteries, calcium score 0   History of echocardiogram    Echo 7/19: EF 50, possible mild hypokinesis of inferolateral and inferior myocardium (consistent with ischemia in the distribution of the RCA or LCx), grade 1 diastolic dysfunction   History of stress test    GXT 7/19: Exercised 9 minutes 41 seconds, no chest pain, no ST changes   Sleep apnea    on CPAP (previously saw Dr. Vickey Huger)    PSH:  Past Surgical History:  Procedure Laterality Date   lesion excised from finger      Social History:  Social History   Socioeconomic History   Marital status: Married    Spouse name: Not on file   Number of children: 1   Years of education: Not on file   Highest education level: Not on file  Occupational History   Occupation: High School Retail buyer    Employer: [employer]    Comment: [employer]  Tobacco Use   Smoking status: Former    Current packs/day: 0.00    Types: Cigarettes    Quit date: 11/30/2013    Years since quitting: 9.2  Smokeless tobacco: Never  Vaping Use   Vaping status: Never Used  Substance and Sexual Activity   Alcohol use: Yes    Alcohol/week: 10.0 standard drinks of alcohol    Types: 10 Standard drinks or equivalent per week   Drug use: No   Sexual activity: Not on file  Other Topics Concern   Not on file  Social History Narrative   Living in [place] area since late 1990s   Social Drivers of Health   Financial Resource Strain: Not on file  Food Insecurity: Not on file  Transportation Needs: Not on file  Physical Activity: Not on file  Stress: Not on file  Social Connections: Unknown  (09/07/2022)   Received [facility]   Social Network    Social Network: Not on file  Intimate Partner Violence: Unknown (09/07/2022)   Received from [facility]   HITS    Physically Hurt: Not on file    Insult or Talk Down To: Not on file    Threaten Physical Harm: Not on file    Scream or Curse: Not on file    Family History:  Family History  Problem Relation Age of Onset   Hypertension Mother    Depression Mother        suicide   Hyperlipidemia Father    Diabetes Maternal Grandmother    Diabetes Paternal Grandmother    Cancer Paternal Grandfather    CAD Other        great grandmother    Medications:   Current Outpatient Medications on File Prior to Visit  Medication Sig Dispense Refill   cyclobenzaprine (FLEXERIL) 10 MG tablet Take 0.5-1 tablets (5-10 mg total) by mouth 3 (three) times daily as needed. 30 tablet 0   ibuprofen (ADVIL) 800 MG tablet Take 1 tablet (800 mg total) by mouth every 8 (eight) hours as needed. 30 tablet 0   Multiple Vitamins-Minerals (MULTIVITAMIN WITH MINERALS) tablet Take 1 tablet by mouth daily.     Omega-3 Fatty Acids (FISH OIL PO) Take 1 capsule by mouth daily.     No current facility-administered medications on file prior to visit.    Allergies:   Allergies  Allergen Reactions   Penicillins Anaphylaxis      OBJECTIVE: *Limited d/t visit type*  General: well developed, well nourished, very pleasant middle-age Caucasian male, seated, in no evident distress  Neurologic Exam Mental Status: Awake and fully alert. Oriented to place and time. Recent and remote memory intact. Attention span, concentration and fund of knowledge appropriate. Mood and affect appropriate.         ASSESSMENT/PLAN: GEROGE GILLIAM is a 48 y.o. year old male    OSA on CPAP : Compliance report shows satisfactory usage with optimal residual AHI.  Continue current pressure settings.  Discussed continued nightly usage with ensuring greater than 4  hours nightly for optimal benefit and per insurance purposes.  Continue to follow with DME company for any needed supplies or CPAP related concerns     Follow up in 1 year via MyChart video visit or call earlier if needed   CC:  PCP: Medicine, [facility] (Inactive)    I spent 15 minutes of face-to-face and non-face-to-face time with patient via MyChart video visit.  This included previsit chart review, lab review, study review, order entry, electronic health record documentation, patient education and discussion regarding above diagnoses and treatment plan and answered all other questions to patient's satisfaction  Ihor Austin, AGNP-BC  [facility] [address] [place], [place] [address]  Phone [phone] Fax [phone] Note: This document was prepared with digital dictation and possible smart phrase technology. Any transcriptional errors that result from this process are unintentional.

## 2023-02-26 ENCOUNTER — Encounter: Payer: Self-pay | Admitting: Adult Health

## 2023-02-26 ENCOUNTER — Telehealth: Payer: 59 | Admitting: Adult Health

## 2023-02-26 DIAGNOSIS — G4733 Obstructive sleep apnea (adult) (pediatric): Secondary | ICD-10-CM

## 2023-11-14 ENCOUNTER — Other Ambulatory Visit (HOSPITAL_BASED_OUTPATIENT_CLINIC_OR_DEPARTMENT_OTHER): Payer: Self-pay

## 2023-11-14 MED ORDER — FLUZONE 0.5 ML IM SUSY
0.5000 mL | PREFILLED_SYRINGE | Freq: Once | INTRAMUSCULAR | 0 refills | Status: AC
  Filled 2023-11-14: qty 0.5, 1d supply, fill #0

## 2023-11-17 ENCOUNTER — Other Ambulatory Visit (HOSPITAL_BASED_OUTPATIENT_CLINIC_OR_DEPARTMENT_OTHER): Payer: Self-pay

## 2024-02-25 NOTE — Progress Notes (Unsigned)
 Setup Date: 12/25/2021

## 2024-02-26 ENCOUNTER — Telehealth: Payer: 59 | Admitting: Adult Health

## 2024-02-26 ENCOUNTER — Encounter: Payer: Self-pay | Admitting: Adult Health

## 2024-02-26 VITALS — BP 138/96 | HR 90 | Ht 70.0 in | Wt 239.0 lb

## 2024-02-26 DIAGNOSIS — G4733 Obstructive sleep apnea (adult) (pediatric): Secondary | ICD-10-CM | POA: Diagnosis not present

## 2024-02-26 DIAGNOSIS — I10 Essential (primary) hypertension: Secondary | ICD-10-CM | POA: Diagnosis not present

## 2024-02-26 NOTE — Addendum Note (Signed)
 Addended by: WHITFIELD RAISIN L on: 02/26/2024 04:27 PM   Modules accepted: Level of Service

## 2024-02-27 NOTE — Progress Notes (Signed)
 SABRA

## 2025-03-09 ENCOUNTER — Telehealth: Admitting: Adult Health
